# Patient Record
Sex: Female | Born: 1990 | Race: Black or African American | Hispanic: No | Marital: Married | Smoking: Never smoker
Health system: Southern US, Community
[De-identification: ages and names within clinical notes are randomized; demographics above are authoritative.]

## PROBLEM LIST (undated history)

## (undated) DIAGNOSIS — D649 Anemia, unspecified: Secondary | ICD-10-CM

## (undated) DIAGNOSIS — D259 Leiomyoma of uterus, unspecified: Secondary | ICD-10-CM

## (undated) DIAGNOSIS — Z8679 Personal history of other diseases of the circulatory system: Secondary | ICD-10-CM

## (undated) DIAGNOSIS — T8859XA Other complications of anesthesia, initial encounter: Secondary | ICD-10-CM

## (undated) HISTORY — DX: Leiomyoma of uterus, unspecified: D25.9

## (undated) HISTORY — DX: Anemia, unspecified: D64.9

## (undated) HISTORY — PX: UTERINE FIBROID SURGERY: SHX826

---

## 2009-09-13 ENCOUNTER — Encounter: Admission: RE | Admit: 2009-09-13 | Discharge: 2009-09-13 | Payer: Self-pay | Admitting: Infectious Diseases

## 2011-06-21 HISTORY — PX: ROBOT ASSISTED MYOMECTOMY: SHX5142

## 2014-04-22 ENCOUNTER — Ambulatory Visit (INDEPENDENT_AMBULATORY_CARE_PROVIDER_SITE_OTHER): Payer: 59 | Admitting: Family Medicine

## 2014-04-22 VITALS — BP 122/78 | HR 78 | Temp 98.1°F | Resp 16 | Ht 63.0 in | Wt 153.0 lb

## 2014-04-22 DIAGNOSIS — Z8742 Personal history of other diseases of the female genital tract: Secondary | ICD-10-CM

## 2014-04-22 DIAGNOSIS — N912 Amenorrhea, unspecified: Secondary | ICD-10-CM | POA: Diagnosis not present

## 2014-04-22 DIAGNOSIS — N939 Abnormal uterine and vaginal bleeding, unspecified: Secondary | ICD-10-CM | POA: Diagnosis not present

## 2014-04-22 DIAGNOSIS — Z86018 Personal history of other benign neoplasm: Secondary | ICD-10-CM

## 2014-04-22 LAB — POCT CBC
GRANULOCYTE PERCENT: 42.5 % (ref 37–80)
HCT, POC: 37.8 % (ref 37.7–47.9)
Hemoglobin: 12 g/dL — AB (ref 12.2–16.2)
Lymph, poc: 2.2 (ref 0.6–3.4)
MCH, POC: 22.5 pg — AB (ref 27–31.2)
MCHC: 31.8 g/dL (ref 31.8–35.4)
MCV: 70.7 fL — AB (ref 80–97)
MID (CBC): 0.4 (ref 0–0.9)
MPV: 7.1 fL (ref 0–99.8)
PLATELET COUNT, POC: 360 10*3/uL (ref 142–424)
POC GRANULOCYTE: 2 (ref 2–6.9)
POC LYMPH PERCENT: 48.1 %L (ref 10–50)
POC MID %: 9.4 % (ref 0–12)
RBC: 5.35 M/uL (ref 4.04–5.48)
RDW, POC: 14.8 %
WBC: 4.6 10*3/uL (ref 4.6–10.2)

## 2014-04-22 LAB — POCT URINE PREGNANCY: Preg Test, Ur: NEGATIVE

## 2014-04-22 LAB — TSH: TSH: 1.045 u[IU]/mL (ref 0.350–4.500)

## 2014-04-22 NOTE — Progress Notes (Addendum)
Subjective:    Patient ID: Jill Herrera, female    DOB: September 11, 1990, 24 y.o.   MRN: [id number] This chart was scribed for Jill Ray, MD by Marti Sleigh, Medical Scribe. This patient was seen in Room 3 and the patient's care was started a 3:19 PM.  Chief Complaint  Patient presents with  . Referral    OBGYN    HPI HPI Comments: Jill Herrera is a 24 y.o. female with a family hx of uterine fibroids who presents to [facility] to obtain a referral for an OBGYN. Pt states she had a thyroid surgically removed three years ago. Pt states her LNMP was in December, and since that time she has had intermittent spotting. Pt states she completely stopped taking her oral birth control in May, 2015. Pt states her oral birth control was supposed to make her have a period once every three months, but she had spotting throughout the three month period between when she was supposed to have her periods. Pt states she has noticed her stomach has been more swollen recently, similar to when she had the fibroid in the past.  Pt also states that she had a large clot of blood come out of her vagina one week ago at 4am, otherwise has just had spotting every few days.  Pt states she has had a pinched nerve in the past, but no other chronic medical problems.   There are no active problems to display for this patient.  Past Medical History  Diagnosis Date  . Anemia   . Fibroid of cervix    Past Surgical History  Procedure Laterality Date  . Uterine fibroid surgery     No Known Allergies Prior to Admission medications   Not on File   History   Social History  . Marital Status: Single    Spouse Name: N/A  . Number of Children: N/A  . Years of Education: N/A   Occupational History  . Not on file.   Social History Main Topics  . Smoking status: Never Smoker   . Smokeless tobacco: Not on file  . Alcohol Use: Not on file  . Drug Use: Not on file  . Sexual Activity: Not on file   Other Topics  Concern  . Not on file   Social History Narrative  . No narrative on file     Review of Systems  Constitutional:       No hx of STI.  Gastrointestinal: Negative for abdominal pain.       Abdominal fullness.  Genitourinary: Positive for vaginal bleeding. Negative for vaginal discharge.       Objective:   Physical Exam  Constitutional: She is oriented to person, place, and time. She appears well-developed and well-nourished. No distress.  HENT:  Head: Normocephalic and atraumatic.  Eyes: Pupils are equal, round, and reactive to light.  Neck: Neck supple.  Cardiovascular: Normal rate.   Pulmonary/Chest: Effort normal. No respiratory distress.  Musculoskeletal: Normal range of motion.  Neurological: She is alert and oriented to person, place, and time. Coordination normal.  Skin: Skin is warm and dry. She is not diaphoretic.  Psychiatric: She has a normal mood and affect. Her behavior is normal.  Nursing note and vitals reviewed.   Filed Vitals:   04/22/14 1453  BP: 122/78  Pulse: 78  Temp: 98.1 F (36.7 C)  TempSrc: Oral  Resp: 16  Height: 5\' 3"  (1.6 m)  Weight: 153 lb (69.4 kg)  SpO2: 100%   Results  for orders placed or performed in visit on 04/22/14  POCT CBC  Result Value Ref Range   WBC 4.6 4.6 - 10.2 K/uL   Lymph, poc 2.2 0.6 - 3.4   POC LYMPH PERCENT 48.1 10 - 50 %L   MID (cbc) 0.4 0 - 0.9   POC MID % 9.4 0 - 12 %M   POC Granulocyte 2.0 2 - 6.9   Granulocyte percent 42.5 37 - 80 %G   RBC 5.35 4.04 - 5.48 M/uL   Hemoglobin 12.0 (A) 12.2 - 16.2 g/dL   HCT, POC 37.8 37.7 - 47.9 %   MCV 70.7 (A) 80 - 97 fL   MCH, POC 22.5 (A) 27 - 31.2 pg   MCHC 31.8 31.8 - 35.4 g/dL   RDW, POC 14.8 %   Platelet Count, POC 360 142 - 424 K/uL   MPV 7.1 0 - 99.8 fL  POCT urine pregnancy  Result Value Ref Range   Preg Test, Ur Negative        Assessment & Plan:  Terris Bodin is a 24 y.o. female Amenorrhea - Plan: POCT CBC, POCT urine pregnancy, TSH, Prolactin,  Ambulatory referral to Gynecology  Abnormal uterine bleeding - Plan: POCT CBC, POCT urine pregnancy, TSH, Prolactin, Ambulatory referral to Gynecology  History of uterine fibroid - Plan: Ambulatory referral to Gynecology  AUB, now with amenorrhea. Prior history of fibroids - structural cause possible, but breakthrough bleeding prior may be due to type of OCP's. hcg negative, tsh and prolactin pending. CBC overall ok. Will refer to obgyn for further eval. Can follow up with me at her convenience for physical as established care with me now.   No orders of the defined types were placed in this encounter.   Patient Instructions  You should receive a call or letter about your lab results within the next week to 10 days. Follow up for physical in next 6 months or so.  Return to the clinic or go to the nearest emergency room if any of your symptoms worsen or new symptoms occur.       I personally performed the services described in this documentation, which was scribed in my presence. The recorded information has been reviewed and considered, and addended by me as needed.

## 2014-04-22 NOTE — Patient Instructions (Signed)
You should receive a call or letter about your lab results within the next week to 10 days. Follow up for physical in next 6 months or so.  Return to the clinic or go to the nearest emergency room if any of your symptoms worsen or new symptoms occur.

## 2014-04-23 LAB — PROLACTIN: Prolactin: 14 ng/mL

## 2014-04-24 ENCOUNTER — Encounter: Payer: Self-pay | Admitting: Radiology

## 2014-04-27 LAB — HM PAP SMEAR: HM PAP: NEGATIVE

## 2014-07-01 ENCOUNTER — Ambulatory Visit (INDEPENDENT_AMBULATORY_CARE_PROVIDER_SITE_OTHER): Payer: 59 | Admitting: Physician Assistant

## 2014-07-01 VITALS — BP 110/70 | HR 73 | Temp 98.4°F | Resp 16 | Ht 63.0 in | Wt 151.4 lb

## 2014-07-01 DIAGNOSIS — Z862 Personal history of diseases of the blood and blood-forming organs and certain disorders involving the immune mechanism: Secondary | ICD-10-CM | POA: Diagnosis not present

## 2014-07-01 DIAGNOSIS — K219 Gastro-esophageal reflux disease without esophagitis: Secondary | ICD-10-CM | POA: Diagnosis not present

## 2014-07-01 LAB — POCT CBC
GRANULOCYTE PERCENT: 42.1 % (ref 37–80)
HCT, POC: 35.8 % — AB (ref 37.7–47.9)
HEMOGLOBIN: 11.3 g/dL — AB (ref 12.2–16.2)
Lymph, poc: 2.6 (ref 0.6–3.4)
MCH: 21.9 pg — AB (ref 27–31.2)
MCHC: 31.4 g/dL — AB (ref 31.8–35.4)
MCV: 69.8 fL — AB (ref 80–97)
MID (cbc): 0.5 (ref 0–0.9)
MPV: 7.4 fL (ref 0–99.8)
POC Granulocyte: 2.3 (ref 2–6.9)
POC LYMPH PERCENT: 48.4 %L (ref 10–50)
POC MID %: 9.5 %M (ref 0–12)
Platelet Count, POC: 363 10*3/uL (ref 142–424)
RBC: 5.13 M/uL (ref 4.04–5.48)
RDW, POC: 14.6 %
WBC: 5.4 10*3/uL (ref 4.6–10.2)

## 2014-07-01 MED ORDER — OMEPRAZOLE 40 MG PO CPDR: DELAYED_RELEASE_CAPSULE | ORAL | Status: DC

## 2014-07-01 NOTE — Patient Instructions (Signed)
If you do not feel better in 48 hours, or feel worse at anytime please call [phone] and leave me a message with the staff.

## 2014-07-01 NOTE — Progress Notes (Signed)
07/01/2014 at 5:56 PM  Jill Herrera / DOB: May 27, 1990 / MRN: [id number]  The patient  does not have a problem list on file.  SUBJECTIVE  Chief complaint: Abdominal Pain and Heartburn   Jill Herrera is a pleasant 24 yo female here today for complaints of mild left upper quadrant abdominal pain and acid reflux over the past 7 days.  She took one Aleve last week for menstrual cramping and reports the pain and reflux started roughly 8 hours later.  She took the medication with food.  She has sickle cell trait. She denies hematemesis and melena and hematochezia. She denies constipation at this time. She denies dysuria, urgency and frequency.  She denies cough, chest pain and palpitations.  She denies SOB with the pain.  She has tried to stop eating spicy foods and this has not helped her symptoms.  She has tried ranitidine with good relief, however it is transient. She has a history of sickle cell trait. This is a new problem for her.     She  has a past medical history of Anemia and Fibroid of cervix.    Medications reviewed and updated by myself where necessary, and exist elsewhere in the encounter.   [name] has No Known Allergies. She  reports that she has never smoked. She does not have any smokeless tobacco history on file. She  has no sexual activity history on file. The patient  has past surgical history that includes Uterine fibroid surgery.  Her family history is not on file.  Review of Systems  Constitutional: Negative for fever.  Respiratory: Negative for cough.   Cardiovascular: Negative for chest pain and palpitations.  Gastrointestinal: Positive for abdominal pain. Negative for heartburn, nausea, vomiting, diarrhea, constipation, blood in stool and melena.  Genitourinary: Negative for dysuria, urgency and frequency.  Musculoskeletal: Negative for myalgias.  Skin: Negative for itching and rash.  Neurological: Negative for dizziness and headaches.    Endo/Heme/Allergies: Does not bruise/bleed easily.     OBJECTIVE  Her  height is 5\' 3"  (1.6 m) and weight is 151 lb 6.4 oz (68.675 kg). Her oral temperature is 98.4 F (36.9 C). Her blood pressure is 110/70 and her pulse is 73. Her respiration is 16 and oxygen saturation is 98%.  The patient's body mass index is 26.83 kg/(m^2).  Physical Exam  Constitutional: She is oriented to person, place, and time. She appears well-developed and well-nourished. No distress.  Neck: Normal range of motion.  Cardiovascular: Normal rate and regular rhythm.   Respiratory: Effort normal and breath sounds normal.  GI: Soft. Bowel sounds are normal. There is no splenomegaly or hepatomegaly. There is tenderness in the right upper quadrant. There is no rigidity, no guarding, no CVA tenderness, no tenderness at McBurney's point and negative Murphy's sign.    Neurological: She is alert and oriented to person, place, and time.  Skin: Skin is warm and dry. She is not diaphoretic. No pallor.  Psychiatric: She has a normal mood and affect. Her speech is normal and behavior is normal. Judgment and thought content normal. Cognition and memory are normal.    Results for orders placed or performed in visit on 07/01/14 (from the past 24 hour(s))  POCT CBC     Status: Abnormal   Collection Time: 07/01/14  5:20 PM  Result Value Ref Range   WBC 5.4 4.6 - 10.2 K/uL   Lymph, poc 2.6 0.6 - 3.4   POC LYMPH PERCENT 48.4 10 - 50 %L  MID (cbc) 0.5 0 - 0.9   POC MID % 9.5 0 - 12 %M   POC Granulocyte 2.3 2 - 6.9   Granulocyte percent 42.1 37 - 80 %G   RBC 5.13 4.04 - 5.48 M/uL   Hemoglobin 11.3 (A) 12.2 - 16.2 g/dL   HCT, POC 35.8 (A) 37.7 - 47.9 %   MCV 69.8 (A) 80 - 97 fL   MCH, POC 21.9 (A) 27 - 31.2 pg   MCHC 31.4 (A) 31.8 - 35.4 g/dL   RDW, POC 14.6 %   Platelet Count, POC 363 142 - 424 K/uL   MPV 7.4 0 - 99.8 fL    [name] was seen today for abdominal pain and heartburn.  Diagnoses and  all orders for this visit:  Gastroesophageal reflux disease, esophagitis presence not specified: CBC stable, exam reassuring, and patient does not appear to be in any distress. Advised that we could evaluate for melena with a hemosure and patient declined in favor of medication. Will treat empirically with PPI for two weeks.  Patient to follow up after this trial. She is to call, RTC, or go to the ED if her symptoms worsen or she becomes distressed.   Orders: -     POCT CBC -     omeprazole (PRILOSEC) 40 MG capsule; Take your first dose now.  Tomorrow take your medication 30 minutes before breakfast.  You should be pain free in 48 hours.    The patient was advised to call or come back to clinic if she does not see an improvement in symptoms, or worsens with the above plan.   Philis Fendt, MHS, PA-C [facility] 07/01/2014 5:56 PM

## 2014-07-02 ENCOUNTER — Encounter: Payer: Self-pay | Admitting: Physician Assistant

## 2015-02-05 ENCOUNTER — Ambulatory Visit (INDEPENDENT_AMBULATORY_CARE_PROVIDER_SITE_OTHER): Payer: 59 | Admitting: Family Medicine

## 2015-02-05 VITALS — BP 126/80 | HR 70 | Temp 99.4°F | Resp 16 | Ht 62.0 in | Wt 149.0 lb

## 2015-02-05 DIAGNOSIS — E282 Polycystic ovarian syndrome: Secondary | ICD-10-CM

## 2015-02-05 DIAGNOSIS — N946 Dysmenorrhea, unspecified: Secondary | ICD-10-CM | POA: Diagnosis not present

## 2015-02-05 DIAGNOSIS — R1030 Lower abdominal pain, unspecified: Secondary | ICD-10-CM

## 2015-02-05 DIAGNOSIS — Z86018 Personal history of other benign neoplasm: Secondary | ICD-10-CM

## 2015-02-05 DIAGNOSIS — Z8742 Personal history of other diseases of the female genital tract: Secondary | ICD-10-CM | POA: Diagnosis not present

## 2015-02-05 LAB — POCT URINALYSIS DIP (MANUAL ENTRY)
Bilirubin, UA: NEGATIVE
Glucose, UA: NEGATIVE
Ketones, POC UA: NEGATIVE
Leukocytes, UA: NEGATIVE
Nitrite, UA: NEGATIVE
Protein Ur, POC: NEGATIVE
Spec Grav, UA: 1.015
Urobilinogen, UA: 0.2
pH, UA: 6.5

## 2015-02-05 LAB — POC MICROSCOPIC URINALYSIS (UMFC): Mucus: ABSENT

## 2015-02-05 LAB — POCT URINE PREGNANCY: Preg Test, Ur: NEGATIVE

## 2015-02-05 MED ORDER — NAPROXEN SODIUM 550 MG PO TABS: 550.0000 mg | ORAL_TABLET | Freq: Two times a day (BID) | ORAL | Status: DC | PRN

## 2015-02-05 MED ORDER — CYCLOBENZAPRINE HCL 10 MG PO TABS: 10.0000 mg | ORAL_TABLET | Freq: Every evening | ORAL | Status: DC | PRN

## 2015-02-05 NOTE — Patient Instructions (Signed)

## 2015-02-05 NOTE — Progress Notes (Signed)
Subjective:    Patient ID: Jill Herrera, female    DOB: 1990/10/24, 24 y.o.   MRN: [id number]  HPI This is a pleasant 24 yo patient who presents today with worsening menstrual cramps. She has had painful periods for several years and takes OCPs. She has not missed any pills in her current pack. She took her last pill on Sunday and had some light bleeding and cramping. She is currently having normal menstrual flow but worsening cramp. She has a history of fibroids with fibroid removal about 3 years ago. She had an ultrasound this spring which showed PCOS. She is planning follow up with ob-gyn. She is currently on OCPs which helps some with cramping, but not completely. She takes tylenol without much relief. She had a left over pain pill which she took with some relief. Is not looking for narcotic pain meds, because she doesn't like the way they make her feel. Has had difficulty sleeping this week due to pain and missed work. 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   Past Medical History  Diagnosis Date  . Anemia   . Fibroid of cervix    Past Surgical History  Procedure Laterality Date  . Uterine fibroid surgery     No family history on file. Social History  Substance Use Topics  . Smoking status: Never Smoker   . Smokeless tobacco: None  . Alcohol Use: None    Review of Systems  Constitutional: Negative for fever and chills.  Gastrointestinal: Positive for abdominal pain (lower, bilateral).  Genitourinary: Positive for vaginal bleeding (menstrual bleeding). Negative for dysuria, hematuria, flank pain, vaginal discharge, vaginal pain and dyspareunia.       Objective:   Physical Exam  Constitutional: She is oriented to person, place, and time. She appears well-developed and well-nourished. No distress.  HENT:  Head: Normocephalic and  atraumatic.  Eyes: Conjunctivae are normal.  Neck: Normal range of motion. Neck supple.  Cardiovascular: Normal rate, regular rhythm and normal heart sounds.   Pulmonary/Chest: Effort normal and breath sounds normal.  Abdominal: Soft. Bowel sounds are normal. She exhibits no distension and no mass. There is no tenderness. There is no rebound and no guarding.  Musculoskeletal: Normal range of motion.  Neurological: She is alert and oriented to person, place, and time.  Skin: Skin is warm and dry. She is not diaphoretic.  Psychiatric: She has a normal mood and affect. Her behavior is normal. Judgment and thought content normal.  Vitals reviewed. BP 126/80 mmHg  Pulse 70  Temp(Src) 99.4 F (37.4 C)  Resp 16  Ht 5\' 2"  (1.575 m)  Wt 149 lb (67.586 kg)  BMI 27.25 kg/m2  SpO2 99%  LMP 02/03/2015 (Exact Date) Results for orders placed or performed in visit on 02/05/15  POCT urine pregnancy  Result Value Ref Range   Preg Test, Ur Negative Negative  POCT urinalysis dipstick  Result Value Ref Range   Color, UA yellow yellow   Clarity, UA hazy (A) clear   Glucose, UA negative negative   Bilirubin, UA negative negative   Ketones, POC UA negative negative   Spec Grav, UA 1.015    Blood, UA large (A) negative   pH, UA 6.5    Protein Ur, POC negative negative   Urobilinogen, UA 0.2    Nitrite, UA Negative Negative   Leukocytes, UA Negative  Negative  POCT Microscopic Urinalysis ([facility])  Result Value Ref Range   WBC,UR,HPF,POC None None WBC/hpf   RBC,UR,HPF,POC Too numerous to count  (A) None RBC/hpf   Bacteria None None, Too numerous to count   Mucus Absent Absent   Epithelial Cells, UR Per Microscopy Few (A) None, Too numerous to count cells/hpf       Assessment & Plan:  1. Lower abdominal pain - POCT urine pregnancy - POCT urinalysis dipstick - POCT Microscopic Urinalysis ([facility])  2. Severe menstrual cramps - naproxen sodium (ANAPROX DS) 550 MG tablet; Take 1 tablet (550 mg  total) by mouth 2 (two) times daily as needed. Take with food  Dispense: 20 tablet; Refill: 1 - cyclobenzaprine (FLEXERIL) 10 MG tablet; Take 1 tablet (10 mg total) by mouth at bedtime as needed for muscle spasms.  Dispense: 20 tablet; Refill: 1  3. History of uterine fibroid - encouraged continued follow up with gyn  4. PCOS (polycystic ovarian syndrome) - discussed pain that could be related to ovarian cysts. Follow up precautions given- worsening of pain, excessive bleeding, fever.   Clarene Reamer, FNP-BC  [facility]  02/05/2015 4:08 PM

## 2015-05-04 ENCOUNTER — Telehealth: Payer: Self-pay | Admitting: Family Medicine

## 2015-05-04 NOTE — Telephone Encounter (Signed)
Spoke with patient about the flu shot.  She is going to decline at this time.  She had a reaction to it last time.  Her arm swelled up and got really itchy around the sight and she felt sick after the shot.

## 2015-06-04 ENCOUNTER — Other Ambulatory Visit: Payer: Self-pay | Admitting: Family Medicine

## 2015-06-04 ENCOUNTER — Ambulatory Visit (INDEPENDENT_AMBULATORY_CARE_PROVIDER_SITE_OTHER): Payer: BLUE CROSS/BLUE SHIELD | Admitting: Family Medicine

## 2015-06-04 ENCOUNTER — Encounter: Payer: Self-pay | Admitting: Family Medicine

## 2015-06-04 VITALS — BP 118/70 | HR 100 | Temp 98.3°F | Resp 17 | Ht 62.5 in | Wt 149.0 lb

## 2015-06-04 DIAGNOSIS — D649 Anemia, unspecified: Secondary | ICD-10-CM | POA: Diagnosis not present

## 2015-06-04 DIAGNOSIS — R55 Syncope and collapse: Secondary | ICD-10-CM | POA: Diagnosis not present

## 2015-06-04 DIAGNOSIS — R42 Dizziness and giddiness: Secondary | ICD-10-CM | POA: Diagnosis not present

## 2015-06-04 DIAGNOSIS — N926 Irregular menstruation, unspecified: Secondary | ICD-10-CM | POA: Diagnosis not present

## 2015-06-04 DIAGNOSIS — E282 Polycystic ovarian syndrome: Secondary | ICD-10-CM

## 2015-06-04 DIAGNOSIS — R21 Rash and other nonspecific skin eruption: Secondary | ICD-10-CM

## 2015-06-04 LAB — POCT URINALYSIS DIP (MANUAL ENTRY)
Bilirubin, UA: NEGATIVE
GLUCOSE UA: NEGATIVE
Ketones, POC UA: NEGATIVE
LEUKOCYTES UA: NEGATIVE
Nitrite, UA: NEGATIVE
PROTEIN UA: NEGATIVE
Spec Grav, UA: 1.02
UROBILINOGEN UA: 0.2
pH, UA: 6.5

## 2015-06-04 LAB — POCT CBC
Granulocyte percent: 42.8 %G (ref 37–80)
HEMATOCRIT: 35.2 % — AB (ref 37.7–47.9)
Hemoglobin: 11.7 g/dL — AB (ref 12.2–16.2)
LYMPH, POC: 2.4 (ref 0.6–3.4)
MCH, POC: 23.3 pg — AB (ref 27–31.2)
MCHC: 33.2 g/dL (ref 31.8–35.4)
MCV: 70.2 fL — AB (ref 80–97)
MID (CBC): 0.2 (ref 0–0.9)
MPV: 7.9 fL (ref 0–99.8)
PLATELET COUNT, POC: 413 10*3/uL (ref 142–424)
POC GRANULOCYTE: 2 (ref 2–6.9)
POC LYMPH %: 52.3 % — AB (ref 10–50)
POC MID %: 4.9 %M (ref 0–12)
RBC: 5.02 M/uL (ref 4.04–5.48)
RDW, POC: 14.5 %
WBC: 4.6 10*3/uL (ref 4.6–10.2)

## 2015-06-04 LAB — POCT URINE PREGNANCY: PREG TEST UR: NEGATIVE

## 2015-06-04 LAB — GLUCOSE, POCT (MANUAL RESULT ENTRY): POC GLUCOSE: 98 mg/dL (ref 70–99)

## 2015-06-04 NOTE — Patient Instructions (Addendum)
IF you received an x-ray today, you will receive an invoice from [facility]. Please contact [facility] at [phone] with questions or concerns regarding your invoice.   IF you received labwork today, you will receive an invoice from [employer]. Please contact [facility] at [phone] with questions or concerns regarding your invoice.   Our billing staff will not be able to assist you with questions regarding bills from these companies.  You will be contacted with the lab results as soon as they are available. The fastest way to get your results is to activate your My Chart account. Instructions are located on the last page of this paperwork. If you have not heard from [place] regarding the results in 2 weeks, please contact this office.     Your anemia is borderline today, can take iron supplement over-the-counter once per day, but this is unlikely the cause of your dizziness. Your EKG, and other tests here today were also reassuring, but I will also send off some kidney, electrolyte tests.  Increase fluid and calorie intake this weekend, but if symptoms are not improving into Monday or Tuesday, I did order a referral for cardiology evaluation. If you are dizzy or lightheaded, recommended against driving, and out of work until your symptoms have improved.  Your rash may be a contact dermatitis, or hives. See information on this below, but you can use cortisone cream over-the-counter to the itching areas, and Benadryl up to every 4-6 hours, but be careful with Benadryl as it can also cause sedation and dizziness. If continues to spread or worsening symptoms, return for recheck.    Return to the clinic or go to the nearest emergency room if any of your symptoms worsen or new symptoms occur.  Syncope Syncope is a medical term for fainting or passing out. This means you lose consciousness and drop to the ground. People are generally unconscious for  less than 5 minutes. You may have some muscle twitches for up to 15 seconds before waking up and returning to normal. Syncope occurs more often in older adults, but it can happen to anyone. While most causes of syncope are not dangerous, syncope can be a sign of a serious medical problem. It is important to seek medical care.  CAUSES  Syncope is caused by a sudden drop in blood flow to the brain. The specific cause is often not determined. Factors that can bring on syncope include:  Taking medicines that lower blood pressure.  Sudden changes in posture, such as standing up quickly.  Taking more medicine than prescribed.  Standing in one place for too long.  Seizure disorders.  Dehydration and excessive exposure to heat.  Low blood sugar (hypoglycemia).  Straining to have a bowel movement.  Heart disease, irregular heartbeat, or other circulatory problems.  Fear, emotional distress, seeing blood, or severe pain. SYMPTOMS  Right before fainting, you may:  Feel dizzy or light-headed.  Feel nauseous.  See all white or all black in your field of vision.  Have cold, clammy skin. DIAGNOSIS  Your health care provider will ask about your symptoms, perform a physical exam, and perform an electrocardiogram (ECG) to record the electrical activity of your heart. Your health care provider may also perform other heart or blood tests to determine the cause of your syncope which may include:  Transthoracic echocardiogram (TTE). During echocardiography, sound waves are used to evaluate how blood flows through your heart.  Transesophageal echocardiogram (TEE).  Cardiac monitoring.  This allows your health care provider to monitor your heart rate and rhythm in real time.  Holter monitor. This is a portable device that records your heartbeat and can help diagnose heart arrhythmias. It allows your health care provider to track your heart activity for several days, if needed.  Stress tests by  exercise or by giving medicine that makes the heart beat faster. TREATMENT  In most cases, no treatment is needed. Depending on the cause of your syncope, your health care provider may recommend changing or stopping some of your medicines. HOME CARE INSTRUCTIONS  Have someone stay with you until you feel stable.  Do not drive, use machinery, or play sports until your health care provider says it is okay.  Keep all follow-up appointments as directed by your health care provider.  Lie down right away if you start feeling like you might faint. Breathe deeply and steadily. Wait until all the symptoms have passed.  Drink enough fluids to keep your urine clear or pale yellow.  If you are taking blood pressure or heart medicine, get up slowly and take several minutes to sit and then stand. This can reduce dizziness. SEEK IMMEDIATE MEDICAL CARE IF:   You have a severe headache.  You have unusual pain in the chest, abdomen, or back.  You are bleeding from your mouth or rectum, or you have black or tarry stool.  You have an irregular or very fast heartbeat.  You have pain with breathing.  You have repeated fainting or seizure-like jerking during an episode.  You faint when sitting or lying down.  You have confusion.  You have trouble walking.  You have severe weakness.  You have vision problems. If you fainted, call your local emergency services (911 in [place] Do not drive yourself to the hospital.    This information is not intended to replace advice given to you by your health care provider. Make sure you discuss any questions you have with your health care provider.   Document Released: 02/06/2005 Document Revised: 06/23/2014 Document Reviewed: 04/07/2011 Elsevier Interactive Patient Education 2016 [employer]  Dizziness Dizziness is a common problem. It is a feeling of unsteadiness or light-headedness. You may feel like you are about to faint. Dizziness can lead to injury  if you stumble or fall. Anyone can become dizzy, but dizziness is more common in older adults. This condition can be caused by a number of things, including medicines, dehydration, or illness. HOME CARE INSTRUCTIONS Taking these steps may help with your condition: Eating and Drinking  Drink enough fluid to keep your urine clear or pale yellow. This helps to keep you from becoming dehydrated. Try to drink more clear fluids, such as water.  Do not drink alcohol.  Limit your caffeine intake if directed by your health care provider.  Limit your salt intake if directed by your health care provider. Activity  Avoid making quick movements.  Rise slowly from chairs and steady yourself until you feel okay.  In the morning, first sit up on the side of the bed. When you feel okay, stand slowly while you hold onto something until you know that your balance is fine.  Move your legs often if you need to stand in one place for a long time. Tighten and relax your muscles in your legs while you are standing.  Do not drive or operate heavy machinery if you feel dizzy.  Avoid bending down if you feel dizzy. Place items in your home so that  they are easy for you to reach without leaning over. Lifestyle  Do not use any tobacco products, including cigarettes, chewing tobacco, or electronic cigarettes. If you need help quitting, ask your health care provider.  Try to reduce your stress level, such as with yoga or meditation. Talk with your health care provider if you need help. General Instructions  Watch your dizziness for any changes.  Take medicines only as directed by your health care provider. Talk with your health care provider if you think that your dizziness is caused by a medicine that you are taking.  Tell a friend or a family member that you are feeling dizzy. If he or she notices any changes in your behavior, have this person call your health care provider.  Keep all follow-up visits as  directed by your health care provider. This is important. SEEK MEDICAL CARE IF:  Your dizziness does not go away.  Your dizziness or light-headedness gets worse.  You feel nauseous.  You have reduced hearing.  You have new symptoms.  You are unsteady on your feet or you feel like the room is spinning. SEEK IMMEDIATE MEDICAL CARE IF:  You vomit or have diarrhea and are unable to eat or drink anything.  You have problems talking, walking, swallowing, or using your arms, hands, or legs.  You feel generally weak.  You are not thinking clearly or you have trouble forming sentences. It may take a friend or family member to notice this.  You have chest pain, abdominal pain, shortness of breath, or sweating.  Your vision changes.  You notice any bleeding.  You have a headache.  You have neck pain or a stiff neck.  You have a fever.   This information is not intended to replace advice given to you by your health care provider. Make sure you discuss any questions you have with your health care provider.  Hives Hives are itchy, red, swollen areas of the skin. They can vary in size and location on your body. Hives can come and go for hours or several days (acute hives) or for several weeks (chronic hives). Hives do not spread from person to person (noncontagious). They may get worse with scratching, exercise, and emotional stress. CAUSES   Allergic reaction to food, additives, or drugs.  Infections, including the common cold.  Illness, such as vasculitis, lupus, or thyroid disease.  Exposure to sunlight, heat, or cold.  Exercise.  Stress.  Contact with chemicals. SYMPTOMS   Red or white swollen patches on the skin. The patches may change size, shape, and location quickly and repeatedly.  Itching.  Swelling of the hands, feet, and face. This may occur if hives develop deeper in the skin. DIAGNOSIS  Your caregiver can usually tell what is wrong by performing a  physical exam. Skin or blood tests may also be done to determine the cause of your hives. In some cases, the cause cannot be determined. TREATMENT  Mild cases usually get better with medicines such as antihistamines. Severe cases may require an emergency epinephrine injection. If the cause of your hives is known, treatment includes avoiding that trigger.  HOME CARE INSTRUCTIONS   Avoid causes that trigger your hives.  Take antihistamines as directed by your caregiver to reduce the severity of your hives. Non-sedating or low-sedating antihistamines are usually recommended. Do not drive while taking an antihistamine.  Take any other medicines prescribed for itching as directed by your caregiver.  Wear loose-fitting clothing.  Keep all follow-up appointments  as directed by your caregiver. SEEK MEDICAL CARE IF:   You have persistent or severe itching that is not relieved with medicine.  You have painful or swollen joints. SEEK IMMEDIATE MEDICAL CARE IF:   You have a fever.  Your tongue or lips are swollen.  You have trouble breathing or swallowing.  You feel tightness in the throat or chest.  You have abdominal pain. These problems may be the first sign of a life-threatening allergic reaction. Call your local emergency services (911 in [place] MAKE SURE YOU:   Understand these instructions.  Will watch your condition.  Will get help right away if you are not doing well or get worse.   This information is not intended to replace advice given to you by your health care provider. Make sure you discuss any questions you have with your health care provider.   Document Released: 02/06/2005 Document Revised: 02/11/2013 Document Reviewed: 05/02/2011 Elsevier Interactive Patient Education 2016 [employer] Dermatitis Dermatitis is redness, soreness, and swelling (inflammation) of the skin. Contact dermatitis is a reaction to certain substances that touch the skin. There are  two types of contact dermatitis:   Irritant contact dermatitis. This type is caused by something that irritates your skin, such as dry hands from washing them too much. This type does not require previous exposure to the substance for a reaction to occur. This type is more common.  Allergic contact dermatitis. This type is caused by a substance that you are allergic to, such as a nickel allergy or poison ivy. This type only occurs if you have been exposed to the substance (allergen) before. Upon a repeat exposure, your body reacts to the substance. This type is less common. CAUSES  Many different substances can cause contact dermatitis. Irritant contact dermatitis is most commonly caused by exposure to:   Makeup.   Soaps.   Detergents.   Bleaches.   Acids.   Metal salts, such as nickel.  Allergic contact dermatitis is most commonly caused by exposure to:   Poisonous plants.   Chemicals.   Jewelry.   Latex.   Medicines.   Preservatives in products, such as clothing.  RISK FACTORS This condition is more likely to develop in:   People who have jobs that expose them to irritants or allergens.  People who have certain medical conditions, such as asthma or eczema.  SYMPTOMS  Symptoms of this condition may occur anywhere on your body where the irritant has touched you or is touched by you. Symptoms include:  Dryness or flaking.   Redness.   Cracks.   Itching.   Pain or a burning feeling.   Blisters.  Drainage of small amounts of blood or clear fluid from skin cracks. With allergic contact dermatitis, there may also be swelling in areas such as the eyelids, mouth, or genitals.  DIAGNOSIS  This condition is diagnosed with a medical history and physical exam. A patch skin test may be performed to help determine the cause. If the condition is related to your job, you may need to see an occupational medicine specialist. TREATMENT Treatment for this  condition includes figuring out what caused the reaction and protecting your skin from further contact. Treatment may also include:   Steroid creams or ointments. Oral steroid medicines may be needed in more severe cases.  Antibiotics or antibacterial ointments, if a skin infection is present.  Antihistamine lotion or an antihistamine taken by mouth to ease itching.  A bandage (dressing). HOME CARE INSTRUCTIONS  Skin Care  Moisturize your skin as needed.   Apply cool compresses to the affected areas.  Try taking a bath with:  Epsom salts. Follow the instructions on the packaging. You can get these at your local pharmacy or grocery store.  Baking soda. Pour a small amount into the bath as directed by your health care provider.  Colloidal oatmeal. Follow the instructions on the packaging. You can get this at your local pharmacy or grocery store.  Try applying baking soda paste to your skin. Stir water into baking soda until it reaches a paste-like consistency.  Do not scratch your skin.  Bathe less frequently, such as every other day.  Bathe in lukewarm water. Avoid using hot water. Medicines  Take or apply over-the-counter and prescription medicines only as told by your health care provider.   If you were prescribed an antibiotic medicine, take or apply your antibiotic as told by your health care provider. Do not stop using the antibiotic even if your condition starts to improve. General Instructions  Keep all follow-up visits as told by your health care provider. This is important.  Avoid the substance that caused your reaction. If you do not know what caused it, keep a journal to try to track what caused it. Write down:  What you eat.  What cosmetic products you use.  What you drink.  What you wear in the affected area. This includes jewelry.  If you were given a dressing, take care of it as told by your health care provider. This includes when to change and  remove it. SEEK MEDICAL CARE IF:   Your condition does not improve with treatment.  Your condition gets worse.  You have signs of infection such as swelling, tenderness, redness, soreness, or warmth in the affected area.  You have a fever.  You have new symptoms. SEEK IMMEDIATE MEDICAL CARE IF:   You have a severe headache, neck pain, or neck stiffness.  You vomit.  You feel very sleepy.  You notice red streaks coming from the affected area.  Your bone or joint underneath the affected area becomes painful after the skin has healed.  The affected area turns darker.  You have difficulty breathing.   This information is not intended to replace advice given to you by your health care provider. Make sure you discuss any questions you have with your health care provider.   Document Released: 02/04/2000 Document Revised: 10/28/2014 Document Reviewed: 06/24/2014 Elsevier Interactive Patient Education 2016 [place] Released: 08/02/2000 Document Revised: 06/23/2014 Document Reviewed: 02/02/2014 Elsevier Interactive Patient Education [employer].

## 2015-06-04 NOTE — Progress Notes (Addendum)
Subjective:  By signing my name below, I, Raven Small, attest that this documentation has been prepared under the direction and in the presence of [name], MD.  Electronically Signed: Thea Alken, ED Scribe. 05/31/2015. 12:15 PM.   Patient ID: Jill Herrera, female    DOB: 1990-08-19, 25 y.o.   MRN: [id number]  HPI Chief Complaint  Patient presents with  . Dizziness  . Nausea  . Rash    on chest   . Immunizations    if ok with illinesses flu shot     HPI Comments: Jill Herrera is a 25 y.o. female who presents to the [facility] complaining of dizziness. Pt was last seen by me 1 year ago for abnormal uterine bleeding. Hx of fibroids. Referred to OBGYN.   Pt states last night while at work she became dizzy and blacked out. She reports waking up on the ground but does not remember falling.  She states fall was witnessed by a coworker, who had told her she had passed out for 1-2 minutes but denied her having seizure like activity. She reports difficulty speaking after fall but none last night or today. Her BP yesterday after fall was 100/70. Blood sugar was not obtained. Pt denies feeling dizzy prior to yesterday. She does have some dizziness today and notes associated blurred vision with dizziness when standing and looking long distance. Pt mentions not eating as much due to stress with both school and work but tries to eat when she can.  She denies CP, palpitation, weakness, numbness, . 100/70. Pt denies hx of DM.    Pt has hx of anemia. Last HBG- 11.3 which was down from 12.0 2 months prior.  Pt did not receive flu shot this year. She states the last time she received this vaccines she had some irritation at the injection site but experiences this with most reactions. She also states she developed a cough and cold.   She was diagnosed with PCOS. She had a change in birth control and now takes microgestin every day. She takes flexeril for menstrual cramps.    She has been on clindamycin for 2 weeks for a Root canal    Pt noticed a slightly itchy rash on chest last night which has gone away.  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   Patient Active Problem List   Diagnosis Date Noted  . PCOS (polycystic ovarian syndrome) 02/05/2015   Past Medical History  Diagnosis Date  . Anemia   . Fibroid of cervix    Past Surgical History  Procedure Laterality Date  . Uterine fibroid surgery     No Known Allergies Prior to Admission medications   Medication Sig Start Date End Date Taking? Authorizing Provider  clindamycin (CLEOCIN) 150 MG capsule Take by mouth 3 (three) times daily.   Yes Historical Provider, MD  cyclobenzaprine (FLEXERIL) 10 MG tablet Take 1 tablet (10 mg total) by mouth at bedtime as needed for muscle spasms. 02/05/15  Yes Elby Beck, FNP  MICROGESTIN 1.5-30 MG-MCG tablet TK 1 T PO QD 01/23/15  Yes Historical Provider, MD   Social History   Social History  . Marital Status: Single    Spouse Name: N/A  . Number of Children: N/A  . Years of Education: N/A   Occupational History  . Not on file.   Social History Main Topics  . Smoking status: Never Smoker   . Smokeless tobacco: Not on file  . Alcohol Use: Not  on file  . Drug Use: Not on file  . Sexual Activity: Not on file   Other Topics Concern  . Not on file   Social History Narrative   Review of Systems  Constitutional: Negative for fever and chills.  Eyes: Positive for visual disturbance.  Respiratory: Negative for shortness of breath.   Cardiovascular: Negative for chest pain, palpitations and leg swelling.  Gastrointestinal: Negative for nausea, vomiting, abdominal pain, diarrhea and blood in stool.  Genitourinary: Negative for enuresis and difficulty urinating.  Neurological: Positive for dizziness and syncope. Negative for seizures, facial asymmetry, speech difficulty and weakness.    Objective:   Physical Exam  Constitutional: She is oriented to  person, place, and time. She appears well-developed and well-nourished. No distress.  HENT:  Head: Normocephalic and atraumatic.  Mouth/Throat: Oropharynx is clear and moist.  Eyes: Conjunctivae and EOM are normal. Pupils are equal, round, and reactive to light. Right eye exhibits no nystagmus. Left eye exhibits no nystagmus.  Neck: Neck supple.  Cardiovascular: Normal rate, regular rhythm and normal heart sounds.  Exam reveals no gallop and no friction rub.   No murmur heard. Pulmonary/Chest: Effort normal and breath sounds normal. She has no wheezes. She has no rales.  Abdominal: Soft. Bowel sounds are normal. There is no tenderness.  Musculoskeletal: Normal range of motion. She exhibits no edema.  Neurological: She is alert and oriented to person, place, and time. She displays a negative Romberg sign.  No pronator drift. Normal finger to nose. Normal heel to toe. No focal weakness.   Skin: Skin is warm and dry.  Very fine pap on lateral breast. No urticarial no nipple discharge.   Psychiatric: She has a normal mood and affect. Her behavior is normal.  Nursing note and vitals reviewed.  At end of visit, she did have a few patches of papules and possible urticarial lesions on her left arm and right arm.  Filed Vitals:   06/04/15 1114  BP: 118/70  Pulse: 100  Temp: 98.3 F (36.8 C)  TempSrc: Oral  Resp: 17  Height: 5' 2.5" (1.588 m)  Weight: 149 lb (67.586 kg)  SpO2: 98%  EKG: sinus rhythm, no acute findings. Results for orders placed or performed in visit on 06/04/15  POCT glucose (manual entry)  Result Value Ref Range   POC Glucose 98 70 - 99 mg/dl  POCT CBC  Result Value Ref Range   WBC 4.6 4.6 - 10.2 K/uL   Lymph, poc 2.4 0.6 - 3.4   POC LYMPH PERCENT 52.3 (A) 10 - 50 %L   MID (cbc) 0.2 0 - 0.9   POC MID % 4.9 0 - 12 %M   POC Granulocyte 2.0 2 - 6.9   Granulocyte percent 42.8 37 - 80 %G   RBC 5.02 4.04 - 5.48 M/uL   Hemoglobin 11.7 (A) 12.2 - 16.2 g/dL   HCT, POC  35.2 (A) 37.7 - 47.9 %   MCV 70.2 (A) 80 - 97 fL   MCH, POC 23.3 (A) 27 - 31.2 pg   MCHC 33.2 31.8 - 35.4 g/dL   RDW, POC 14.5 %   Platelet Count, POC 413 142 - 424 K/uL   MPV 7.9 0 - 99.8 fL  POCT urinalysis dipstick  Result Value Ref Range   Color, UA yellow yellow   Clarity, UA clear clear   Glucose, UA negative negative   Bilirubin, UA negative negative   Ketones, POC UA negative negative   Spec Grav, UA  1.020    Blood, UA trace-intact (A) negative   pH, UA 6.5    Protein Ur, POC negative negative   Urobilinogen, UA 0.2    Nitrite, UA Negative Negative   Leukocytes, UA Negative Negative   Orthostatic VS for the past 24 hrs:  BP- Lying Pulse- Lying BP- Sitting Pulse- Sitting BP- Standing at 0 minutes Pulse- Standing at 0 minutes  06/04/15 1313 124/80 mmHg 77 129/83 mmHg 74 130/85 mmHg 78     Assessment & Plan:  . Elberta Calia is a 25 y.o. female Dizziness - Plan: POCT glucose (manual entry), Basic metabolic panel, POCT CBC, POCT urinalysis dipstick, EKG 12-Lead, Orthostatic vital signs, Ambulatory referral to Cardiology, CANCELED: Ambulatory referral to Neurology  Syncope and collapse - Plan: POCT glucose (manual entry), Basic metabolic panel, POCT CBC, POCT urinalysis dipstick, EKG 12-Lead, Orthostatic vital signs, Ambulatory referral to Cardiology, CANCELED: Ambulatory referral to Neurology  Anemia, unspecified anemia type - Plan: POCT CBC  Irregular menses - Plan: POCT urine pregnancy  PCOS (polycystic ovarian syndrome) - Plan: POCT urine pregnancy  History of PCOS, slight anemia with onset of dizziness followed by syncopal episode last night lasting approximately 2 minutes. No associated seizure activity or postictal symptoms. Still some persistent dizziness today, but nonfocal neurologic exam, no acute findings on EKG or concerning lab findings.  -check BMP, refer to cardiology, but initially can try increasing fluid and calorie intake over the weekend. If any  worsening of symptoms, call 911 or ER prior to cardiology evaluation.   Rash - arms, chest. Contact derm vs urticarial.  Can try topical cortisone cream, Benadryl over-the-counter if needed, but cautioned on dizziness and sedation with Benadryl. RTC precautions if continued spread or worsening.   Patient Instructions       IF you received an x-ray today, you will receive an invoice from [facility]. Please contact [facility] at [phone] with questions or concerns regarding your invoice.   IF you received labwork today, you will receive an invoice from [employer]. Please contact [facility] at [phone] with questions or concerns regarding your invoice.   Our billing staff will not be able to assist you with questions regarding bills from these companies.  You will be contacted with the lab results as soon as they are available. The fastest way to get your results is to activate your My Chart account. Instructions are located on the last page of this paperwork. If you have not heard from [place] regarding the results in 2 weeks, please contact this office.     Your anemia is borderline today, can take iron supplement over-the-counter once per day, but this is unlikely the cause of your dizziness. Your EKG, and other tests here today were also reassuring, but I will also send off some kidney, electrolyte tests.  Increase fluid and calorie intake this weekend, but if symptoms are not improving into Monday or Tuesday, I did order a referral for cardiology evaluation. If you are dizzy or lightheaded, recommended against driving, and out of work until your symptoms have improved.  Your rash may be a contact dermatitis, or hives. See information on this below, but you can use cortisone cream over-the-counter to the itching areas, and Benadryl up to every 4-6 hours, but be careful with Benadryl as it can also cause sedation and dizziness. If continues to  spread or worsening symptoms, return for recheck.    Return to the clinic or go to the nearest emergency room if any  of your symptoms worsen or new symptoms occur.  Syncope Syncope is a medical term for fainting or passing out. This means you lose consciousness and drop to the ground. People are generally unconscious for less than 5 minutes. You may have some muscle twitches for up to 15 seconds before waking up and returning to normal. Syncope occurs more often in older adults, but it can happen to anyone. While most causes of syncope are not dangerous, syncope can be a sign of a serious medical problem. It is important to seek medical care.  CAUSES  Syncope is caused by a sudden drop in blood flow to the brain. The specific cause is often not determined. Factors that can bring on syncope include:  Taking medicines that lower blood pressure.  Sudden changes in posture, such as standing up quickly.  Taking more medicine than prescribed.  Standing in one place for too long.  Seizure disorders.  Dehydration and excessive exposure to heat.  Low blood sugar (hypoglycemia).  Straining to have a bowel movement.  Heart disease, irregular heartbeat, or other circulatory problems.  Fear, emotional distress, seeing blood, or severe pain. SYMPTOMS  Right before fainting, you may:  Feel dizzy or light-headed.  Feel nauseous.  See all white or all black in your field of vision.  Have cold, clammy skin. DIAGNOSIS  Your health care provider will ask about your symptoms, perform a physical exam, and perform an electrocardiogram (ECG) to record the electrical activity of your heart. Your health care provider may also perform other heart or blood tests to determine the cause of your syncope which may include:  Transthoracic echocardiogram (TTE). During echocardiography, sound waves are used to evaluate how blood flows through your heart.  Transesophageal echocardiogram (TEE).  Cardiac  monitoring. This allows your health care provider to monitor your heart rate and rhythm in real time.  Holter monitor. This is a portable device that records your heartbeat and can help diagnose heart arrhythmias. It allows your health care provider to track your heart activity for several days, if needed.  Stress tests by exercise or by giving medicine that makes the heart beat faster. TREATMENT  In most cases, no treatment is needed. Depending on the cause of your syncope, your health care provider may recommend changing or stopping some of your medicines. HOME CARE INSTRUCTIONS  Have someone stay with you until you feel stable.  Do not drive, use machinery, or play sports until your health care provider says it is okay.  Keep all follow-up appointments as directed by your health care provider.  Lie down right away if you start feeling like you might faint. Breathe deeply and steadily. Wait until all the symptoms have passed.  Drink enough fluids to keep your urine clear or pale yellow.  If you are taking blood pressure or heart medicine, get up slowly and take several minutes to sit and then stand. This can reduce dizziness. SEEK IMMEDIATE MEDICAL CARE IF:   You have a severe headache.  You have unusual pain in the chest, abdomen, or back.  You are bleeding from your mouth or rectum, or you have black or tarry stool.  You have an irregular or very fast heartbeat.  You have pain with breathing.  You have repeated fainting or seizure-like jerking during an episode.  You faint when sitting or lying down.  You have confusion.  You have trouble walking.  You have severe weakness.  You have vision problems. If you fainted, call your  local emergency services (911 in [place] Do not drive yourself to the hospital.    This information is not intended to replace advice given to you by your health care provider. Make sure you discuss any questions you have with your health care  provider.   Document Released: 02/06/2005 Document Revised: 06/23/2014 Document Reviewed: 04/07/2011 Elsevier Interactive Patient Education 2016 [employer] Inc[employer]  Dizziness Dizziness is a common problem. It is a feeling of unsteadiness or light-headedness. You may feel like you are about to faint. Dizziness can lead to injury if you stumble or fall. Anyone can become dizzy, but dizziness is more common in older adults. This condition can be caused by a number of things, including medicines, dehydration, or illness. HOME CARE INSTRUCTIONS Taking these steps may help with your condition: Eating and Drinking  Drink enough fluid to keep your urine clear or pale yellow. This helps to keep you from becoming dehydrated. Try to drink more clear fluids, such as water.  Do not drink alcohol.  Limit your caffeine intake if directed by your health care provider.  Limit your salt intake if directed by your health care provider. Activity  Avoid making quick movements.  Rise slowly from chairs and steady yourself until you feel okay.  In the morning, first sit up on the side of the bed. When you feel okay, stand slowly while you hold onto something until you know that your balance is fine.  Move your legs often if you need to stand in one place for a long time. Tighten and relax your muscles in your legs while you are standing.  Do not drive or operate heavy machinery if you feel dizzy.  Avoid bending down if you feel dizzy. Place items in your home so that they are easy for you to reach without leaning over. Lifestyle  Do not use any tobacco products, including cigarettes, chewing tobacco, or electronic cigarettes. If you need help quitting, ask your health care provider.  Try to reduce your stress level, such as with yoga or meditation. Talk with your health care provider if you need help. General Instructions  Watch your dizziness for any changes.  Take medicines only as directed by your  health care provider. Talk with your health care provider if you think that your dizziness is caused by a medicine that you are taking.  Tell a friend or a family member that you are feeling dizzy. If he or she notices any changes in your behavior, have this person call your health care provider.  Keep all follow-up visits as directed by your health care provider. This is important. SEEK MEDICAL CARE IF:  Your dizziness does not go away.  Your dizziness or light-headedness gets worse.  You feel nauseous.  You have reduced hearing.  You have new symptoms.  You are unsteady on your feet or you feel like the room is spinning. SEEK IMMEDIATE MEDICAL CARE IF:  You vomit or have diarrhea and are unable to eat or drink anything.  You have problems talking, walking, swallowing, or using your arms, hands, or legs.  You feel generally weak.  You are not thinking clearly or you have trouble forming sentences. It may take a friend or family member to notice this.  You have chest pain, abdominal pain, shortness of breath, or sweating.  Your vision changes.  You notice any bleeding.  You have a headache.  You have neck pain or a stiff neck.  You have a fever.   This  information is not intended to replace advice given to you by your health care provider. Make sure you discuss any questions you have with your health care provider.  Hives Hives are itchy, red, swollen areas of the skin. They can vary in size and location on your body. Hives can come and go for hours or several days (acute hives) or for several weeks (chronic hives). Hives do not spread from person to person (noncontagious). They may get worse with scratching, exercise, and emotional stress. CAUSES   Allergic reaction to food, additives, or drugs.  Infections, including the common cold.  Illness, such as vasculitis, lupus, or thyroid disease.  Exposure to sunlight, heat, or cold.  Exercise.  Stress.  Contact  with chemicals. SYMPTOMS   Red or white swollen patches on the skin. The patches may change size, shape, and location quickly and repeatedly.  Itching.  Swelling of the hands, feet, and face. This may occur if hives develop deeper in the skin. DIAGNOSIS  Your caregiver can usually tell what is wrong by performing a physical exam. Skin or blood tests may also be done to determine the cause of your hives. In some cases, the cause cannot be determined. TREATMENT  Mild cases usually get better with medicines such as antihistamines. Severe cases may require an emergency epinephrine injection. If the cause of your hives is known, treatment includes avoiding that trigger.  HOME CARE INSTRUCTIONS   Avoid causes that trigger your hives.  Take antihistamines as directed by your caregiver to reduce the severity of your hives. Non-sedating or low-sedating antihistamines are usually recommended. Do not drive while taking an antihistamine.  Take any other medicines prescribed for itching as directed by your caregiver.  Wear loose-fitting clothing.  Keep all follow-up appointments as directed by your caregiver. SEEK MEDICAL CARE IF:   You have persistent or severe itching that is not relieved with medicine.  You have painful or swollen joints. SEEK IMMEDIATE MEDICAL CARE IF:   You have a fever.  Your tongue or lips are swollen.  You have trouble breathing or swallowing.  You feel tightness in the throat or chest.  You have abdominal pain. These problems may be the first sign of a life-threatening allergic reaction. Call your local emergency services (911 in [place] MAKE SURE YOU:   Understand these instructions.  Will watch your condition.  Will get help right away if you are not doing well or get worse.   This information is not intended to replace advice given to you by your health care provider. Make sure you discuss any questions you have with your health care provider.    Document Released: 02/06/2005 Document Revised: 02/11/2013 Document Reviewed: 05/02/2011 Elsevier Interactive Patient Education 2016 [employer] Dermatitis Dermatitis is redness, soreness, and swelling (inflammation) of the skin. Contact dermatitis is a reaction to certain substances that touch the skin. There are two types of contact dermatitis:   Irritant contact dermatitis. This type is caused by something that irritates your skin, such as dry hands from washing them too much. This type does not require previous exposure to the substance for a reaction to occur. This type is more common.  Allergic contact dermatitis. This type is caused by a substance that you are allergic to, such as a nickel allergy or poison ivy. This type only occurs if you have been exposed to the substance (allergen) before. Upon a repeat exposure, your body reacts to the substance. This type is less common. CAUSES  Many different substances can cause contact dermatitis. Irritant contact dermatitis is most commonly caused by exposure to:   Makeup.   Soaps.   Detergents.   Bleaches.   Acids.   Metal salts, such as nickel.  Allergic contact dermatitis is most commonly caused by exposure to:   Poisonous plants.   Chemicals.   Jewelry.   Latex.   Medicines.   Preservatives in products, such as clothing.  RISK FACTORS This condition is more likely to develop in:   People who have jobs that expose them to irritants or allergens.  People who have certain medical conditions, such as asthma or eczema.  SYMPTOMS  Symptoms of this condition may occur anywhere on your body where the irritant has touched you or is touched by you. Symptoms include:  Dryness or flaking.   Redness.   Cracks.   Itching.   Pain or a burning feeling.   Blisters.  Drainage of small amounts of blood or clear fluid from skin cracks. With allergic contact dermatitis, there may also be swelling  in areas such as the eyelids, mouth, or genitals.  DIAGNOSIS  This condition is diagnosed with a medical history and physical exam. A patch skin test may be performed to help determine the cause. If the condition is related to your job, you may need to see an occupational medicine specialist. TREATMENT Treatment for this condition includes figuring out what caused the reaction and protecting your skin from further contact. Treatment may also include:   Steroid creams or ointments. Oral steroid medicines may be needed in more severe cases.  Antibiotics or antibacterial ointments, if a skin infection is present.  Antihistamine lotion or an antihistamine taken by mouth to ease itching.  A bandage (dressing). HOME CARE INSTRUCTIONS Skin Care  Moisturize your skin as needed.   Apply cool compresses to the affected areas.  Try taking a bath with:  Epsom salts. Follow the instructions on the packaging. You can get these at your local pharmacy or grocery store.  Baking soda. Pour a small amount into the bath as directed by your health care provider.  Colloidal oatmeal. Follow the instructions on the packaging. You can get this at your local pharmacy or grocery store.  Try applying baking soda paste to your skin. Stir water into baking soda until it reaches a paste-like consistency.  Do not scratch your skin.  Bathe less frequently, such as every other day.  Bathe in lukewarm water. Avoid using hot water. Medicines  Take or apply over-the-counter and prescription medicines only as told by your health care provider.   If you were prescribed an antibiotic medicine, take or apply your antibiotic as told by your health care provider. Do not stop using the antibiotic even if your condition starts to improve. General Instructions  Keep all follow-up visits as told by your health care provider. This is important.  Avoid the substance that caused your reaction. If you do not know what  caused it, keep a journal to try to track what caused it. Write down:  What you eat.  What cosmetic products you use.  What you drink.  What you wear in the affected area. This includes jewelry.  If you were given a dressing, take care of it as told by your health care provider. This includes when to change and remove it. SEEK MEDICAL CARE IF:   Your condition does not improve with treatment.  Your condition gets worse.  You have signs of infection such  as swelling, tenderness, redness, soreness, or warmth in the affected area.  You have a fever.  You have new symptoms. SEEK IMMEDIATE MEDICAL CARE IF:   You have a severe headache, neck pain, or neck stiffness.  You vomit.  You feel very sleepy.  You notice red streaks coming from the affected area.  Your bone or joint underneath the affected area becomes painful after the skin has healed.  The affected area turns darker.  You have difficulty breathing.   This information is not intended to replace advice given to you by your health care provider. Make sure you discuss any questions you have with your health care provider.   Document Released: 02/04/2000 Document Revised: 10/28/2014 Document Reviewed: 06/24/2014 Elsevier Interactive Patient Education 2016 [place] Released: 08/02/2000 Document Revised: 06/23/2014 Document Reviewed: 02/02/2014 Elsevier Interactive Patient Education [employer].      I personally performed the services described in this documentation, which was scribed in my presence. The recorded information has been reviewed and considered, and addended by me as needed.

## 2015-06-05 LAB — BASIC METABOLIC PANEL
BUN: 8 mg/dL (ref 7–25)
CHLORIDE: 104 mmol/L (ref 98–110)
CO2: 25 mmol/L (ref 20–31)
CREATININE: 0.62 mg/dL (ref 0.50–1.10)
Calcium: 9.5 mg/dL (ref 8.6–10.2)
Glucose, Bld: 81 mg/dL (ref 65–99)
Potassium: 4.2 mmol/L (ref 3.5–5.3)
Sodium: 136 mmol/L (ref 135–146)

## 2015-12-16 ENCOUNTER — Ambulatory Visit (INDEPENDENT_AMBULATORY_CARE_PROVIDER_SITE_OTHER): Payer: BLUE CROSS/BLUE SHIELD | Admitting: Family Medicine

## 2015-12-16 VITALS — BP 110/70 | HR 80 | Temp 98.3°F | Resp 18 | Ht 62.5 in | Wt 151.0 lb

## 2015-12-16 DIAGNOSIS — J069 Acute upper respiratory infection, unspecified: Secondary | ICD-10-CM | POA: Diagnosis not present

## 2015-12-16 DIAGNOSIS — B9789 Other viral agents as the cause of diseases classified elsewhere: Secondary | ICD-10-CM | POA: Diagnosis not present

## 2015-12-16 MED ORDER — ALBUTEROL SULFATE HFA 108 (90 BASE) MCG/ACT IN AERS: 2.0000 | INHALATION_SPRAY | Freq: Four times a day (QID) | RESPIRATORY_TRACT | 0 refills | Status: DC | PRN

## 2015-12-16 MED ORDER — CETIRIZINE HCL 10 MG PO TABS
10.0000 mg | ORAL_TABLET | Freq: Every day | ORAL | 11 refills | Status: DC
Start: 2015-12-16 — End: 2016-10-26

## 2015-12-16 MED ORDER — GUAIFENESIN-CODEINE 200-10 MG/5ML PO LIQD: 5.0000 mL | Freq: Three times a day (TID) | ORAL | 0 refills | Status: DC | PRN

## 2015-12-16 NOTE — Patient Instructions (Addendum)
You have a cold which is caused a cough. You should get better in the next several days.  You do have some asthma-like symptoms in her lungs. Albuterol will help with this. Use this every 4-6 hours as needed for the next several days.  Take the cetirizine which is an allergy medicine to help open up her nose and breathe better. This is over-the-counter.  The prescription cough syrup has codeine in it which may make you little sleepy.  If it does do not use this during the day.    If you're not better in the next week come back and see [place]!    IF you received an x-ray today, you will receive an invoice from [facility]. Please contact [facility] at [phone] with questions or concerns regarding your invoice.   IF you received labwork today, you will receive an invoice from [employer]. Please contact [facility] at [phone] with questions or concerns regarding your invoice.   Our billing staff will not be able to assist you with questions regarding bills from these companies.  You will be contacted with the lab results as soon as they are available. The fastest way to get your results is to activate your My Chart account. Instructions are located on the last page of this paperwork. If you have not heard from [place] regarding the results in 2 weeks, please contact this office.

## 2015-12-16 NOTE — Progress Notes (Signed)
   Jill Herrera is a 25 y.o. female who presents to [facility] today for cough:  1.  Cough and URI symptoms:  SUBJECTIVE: URI symptoms:  Jill Herrera is a 25 y.o. female who complains of URI symptoms present for past 3 days.  Describes rhinorrhea, sinus congestion, mild cough.  Has tried no OTC meds. Has used albuterol in past for asthma like symptoms, but never diagnosed..  Sick contacts are coworkers.  No fevers or chills. No nausea or vomiting.  Denies smoking cigarettes.  She has past history significant for seasonal allergies and anemia treated with iron.  She has had fibroid surgery in the past.  Family history: Father is healthy. Mother has history of hypertension and diabetes.  Social history: She is a never smoker denies any alcohol or illicit drug use.  OBJECTIVE: BP 110/70   Pulse 80   Temp 98.3 F (36.8 C) (Oral)   Resp 18   Ht 5' 2.5" (1.588 m)   Wt 151 lb (68.5 kg)   LMP 10/02/2015   SpO2 99%   BMI 27.18 kg/m  Gen:  Patient sitting on exam table, appears stated age in no acute distress Head: Normocephalic atraumatic Eyes: EOMI, PERRL, sclera and conjunctiva non-erythematous Ears:  Canals clear bilaterally.  TMs pearly gray bilaterally without erythema or bulging.   Nose:  Nasal turbinates grossly enlarged bilaterally. Some exudates noted. Tender to palpation of maxillary sinus  Mouth: Mucosa membranes moist. Tonsils +2, nonenlarged, non-erythematous.  Throat and pharynx normal appearing. Neck: No cervical lymphadenopathy noted Heart:  RRR, no murmurs auscultated. Pulm:  Some mild wheezing at bases BL.    Imp/plan 1.  Viral URI with cough:  - Likely viral illness based on symptoms and history.  No signs of bacterial illness. Symptomatic treatment for now, see instructions.  Prescription cough syrup for cough that's worse at night. Does have some mild wheezing.  Should return in 4 - 6 weeks for PFTs.  Albuterol refill in meantime.    Return if worsening or no improvement in 1 week.

## 2015-12-23 DIAGNOSIS — R875 Abnormal microbiological findings in specimens from female genital organs: Secondary | ICD-10-CM | POA: Diagnosis not present

## 2015-12-23 DIAGNOSIS — N913 Primary oligomenorrhea: Secondary | ICD-10-CM | POA: Diagnosis not present

## 2015-12-23 DIAGNOSIS — Z01419 Encounter for gynecological examination (general) (routine) without abnormal findings: Secondary | ICD-10-CM | POA: Diagnosis not present

## 2015-12-23 DIAGNOSIS — Z6828 Body mass index (BMI) 28.0-28.9, adult: Secondary | ICD-10-CM | POA: Diagnosis not present

## 2015-12-23 LAB — HM PAP SMEAR: HM Pap smear: NEGATIVE

## 2016-01-17 DIAGNOSIS — N854 Malposition of uterus: Secondary | ICD-10-CM | POA: Diagnosis not present

## 2016-01-17 DIAGNOSIS — Z30430 Encounter for insertion of intrauterine contraceptive device: Secondary | ICD-10-CM | POA: Diagnosis not present

## 2016-02-22 DIAGNOSIS — Z30431 Encounter for routine checking of intrauterine contraceptive device: Secondary | ICD-10-CM | POA: Diagnosis not present

## 2016-10-26 ENCOUNTER — Encounter: Payer: Self-pay | Admitting: Physician Assistant

## 2016-10-26 ENCOUNTER — Ambulatory Visit (INDEPENDENT_AMBULATORY_CARE_PROVIDER_SITE_OTHER): Payer: BLUE CROSS/BLUE SHIELD | Admitting: Physician Assistant

## 2016-10-26 VITALS — BP 120/80 | HR 71 | Temp 99.5°F | Ht 62.25 in | Wt 158.0 lb

## 2016-10-26 DIAGNOSIS — J029 Acute pharyngitis, unspecified: Secondary | ICD-10-CM | POA: Diagnosis not present

## 2016-10-26 LAB — POCT RAPID STREP A (OFFICE): Rapid Strep A Screen: NEGATIVE

## 2016-10-26 MED ORDER — PREDNISONE 20 MG PO TABS: 20.0000 mg | ORAL_TABLET | Freq: Every day | ORAL | 0 refills | Status: DC

## 2016-10-26 NOTE — Progress Notes (Signed)
Jill Herrera is a 26 y.o. female here to [place] and having sore throat and left ear pain.  I acted as a Education administrator for [employer], PA-C Anselmo Pickler, LPN  History of Present Illness:   Chief Complaint  Patient presents with  . Establish Care    BC/BS  . Sore Throat    started Tues  . Left ear pain    Acute Concerns: Sore throat and L ear pain -- she went to a wedding over the weekend and since then has had sore throat, low grade fever, L ear pain and today has slight cough. She has been taking Nyquil and Tylenol without much relief. Hx of asthma with illnesses, typically receives an albuterol inhaler, but has not had any difficulty breathing or pain. Measured temperature at home is 99.5. Not drinking enough fluids per her report. Appetite diminished with some intermittent nausea. No hx of PNA.   Chronic Issues: None  Health Maintenance: Immunizations -- up to date, will return for flu PAP -- 12/2015 - normal Weight -- Weight: 158 lb (71.7 kg)   Depression screen PHQ 2/9 10/26/2016  Decreased Interest 0  Down, Depressed, Hopeless 0  PHQ - 2 Score 0    No flowsheet data found.  Other providers/specialists: [facility] -- Dr. Dellis Filbert Eye doctor -- Dr. Luretha Rued  Past Medical History:  Diagnosis Date  . Anemia   . Fibroid of cervix      Social History   Social History  . Marital status: Single    Spouse name: N/A  . Number of children: N/A  . Years of education: N/A   Occupational History  . Not on file.   Social History Main Topics  . Smoking status: Never Smoker  . Smokeless tobacco: Never Used  . Alcohol use 0.6 oz/week    1 Glasses of wine per week  . Drug use: No  . Sexual activity: Yes    Birth control/ protection: IUD   Other Topics Concern  . Not on file   Social History Narrative   Memory Care Coordinator for [employer]   Lives in [place]   In a relationship    Past Surgical History:  Procedure Laterality Date  . ROBOT  ASSISTED MYOMECTOMY N/A 06/2011  . UTERINE FIBROID SURGERY      Family History  Problem Relation Age of Onset  . Asthma Mother   . Diabetes Mother   . Miscarriages / [place] Mother     No Known Allergies   Current Medications:   Current Outpatient Prescriptions:  .  albuterol (PROVENTIL HFA;VENTOLIN HFA) 108 (90 Base) MCG/ACT inhaler, Inhale 2 puffs into the lungs every 6 (six) hours as needed for wheezing or shortness of breath., Disp: 1 Inhaler, Rfl: 0 .  levonorgestrel (MIRENA) 20 MCG/24HR IUD, 1 each by Intrauterine route once. Inserted 12/2015, needs to be removed 12/2021., Disp: , Rfl:  .  predniSONE (DELTASONE) 20 MG tablet, Take 1 tablet (20 mg total) by mouth daily with breakfast., Disp: 5 tablet, Rfl: 0   Review of Systems:   Review of Systems  Constitutional: Positive for fever. Negative for chills, malaise/fatigue and weight loss.  Eyes: Negative for blurred vision and double vision.  Respiratory: Positive for cough and sputum production. Negative for shortness of breath and wheezing.   Cardiovascular: Negative for chest pain, orthopnea, claudication and leg swelling.  Gastrointestinal: Positive for nausea. Negative for abdominal pain, constipation, diarrhea, heartburn and vomiting.  Neurological: Positive for headaches. Negative for  dizziness and tingling.    Vitals:   Vitals:   10/26/16 1119  BP: 120/80  Pulse: 71  Temp: 99.5 F (37.5 C)  TempSrc: Oral  SpO2: 98%  Weight: 158 lb (71.7 kg)  Height: 5' 2.25" (1.581 m)     Body mass index is 28.67 kg/m.  Physical Exam:   Physical Exam  Constitutional: She appears well-developed. She is cooperative.  Non-toxic appearance. She does not have a sickly appearance. She does not appear ill. No distress.  HENT:  Head: Normocephalic and atraumatic.  Right Ear: Tympanic membrane, external ear and ear canal normal. Tympanic membrane is not erythematous, not retracted and not bulging.  Left Ear: Tympanic  membrane, external ear and ear canal normal. Tympanic membrane is not erythematous, not retracted and not bulging.  Nose: Mucosal edema and rhinorrhea present. Right sinus exhibits no maxillary sinus tenderness and no frontal sinus tenderness. Left sinus exhibits no maxillary sinus tenderness and no frontal sinus tenderness.  Mouth/Throat: Uvula is midline. Mucous membranes are dry. Posterior oropharyngeal erythema present. No posterior oropharyngeal edema. Tonsils are 1+ on the right. Tonsils are 1+ on the left. No tonsillar exudate.  Eyes: Conjunctivae and lids are normal.  Neck: Trachea normal.  Cardiovascular: Normal rate, regular rhythm, S1 normal, S2 normal and normal heart sounds.   Pulmonary/Chest: Effort normal and breath sounds normal. She has no decreased breath sounds. She has no wheezes. She has no rhonchi. She has no rales.  Lymphadenopathy:    She has no cervical adenopathy.  Neurological: She is alert.  Skin: Skin is warm, dry and intact.  Psychiatric: She has a normal mood and affect. Her speech is normal and behavior is normal.  Nursing note and vitals reviewed.  Results for orders placed or performed in visit on 10/26/16  POCT rapid strep A  Result Value Ref Range   Rapid Strep A Screen Negative Negative    Assessment and Plan:    Carren was seen today for establish care, sore throat and left ear pain.  Diagnoses and all orders for this visit:  Viral pharyngitis Rapid strep test negative. Suspect viral pharyngitis. May continue Tylenol. Start low dose 20 mg Prednisone daily x 5 days. Follow-up if symptoms worsen or persist. No indication for antibiotics as this time. Return to clinic precautions given and reviewed with patient. She declined a refill on her albuterol inhaler today -- no evidence of wheezing on my exam. -     POCT rapid strep A  Other orders -     predniSONE (DELTASONE) 20 MG tablet; Take 1 tablet (20 mg total) by mouth daily with  breakfast.    . Reviewed expectations re: course of current medical issues. . Discussed self-management of symptoms. . Outlined signs and symptoms indicating need for more acute intervention. . Patient verbalized understanding and all questions were answered. . See orders for this visit as documented in the electronic medical record. . Patient received an After-Visit Summary.   CMA or LPN served as scribe during this visit. History, Physical, and Plan performed by medical provider. Documentation and orders reviewed and attested to.   Inda Coke, PA-C

## 2016-10-26 NOTE — Patient Instructions (Signed)
It was great to meet you!  Take the prednisone daily for 5 days -- take with food.  Stay hydrated, drink enough fluids to keep your urine clear.  Keep [place] posted if you begin to feel worse or develop fever >100.5 or shortness of breath.  Pharyngitis Pharyngitis is redness, pain, and swelling (inflammation) of your pharynx. What are the causes? Pharyngitis is usually caused by infection. Most of the time, these infections are from viruses (viral) and are part of a cold. However, sometimes pharyngitis is caused by bacteria (bacterial). Pharyngitis can also be caused by allergies. Viral pharyngitis may be spread from person to person by coughing, sneezing, and personal items or utensils (cups, forks, spoons, toothbrushes). Bacterial pharyngitis may be spread from person to person by more intimate contact, such as kissing. What are the signs or symptoms? Symptoms of pharyngitis include:  Sore throat.  Tiredness (fatigue).  Low-grade fever.  Headache.  Joint pain and muscle aches.  Skin rashes.  Swollen lymph nodes.  Plaque-like film on throat or tonsils (often seen with bacterial pharyngitis).  How is this diagnosed? Your health care provider will ask you questions about your illness and your symptoms. Your medical history, along with a physical exam, is often all that is needed to diagnose pharyngitis. Sometimes, a rapid strep test is done. Other lab tests may also be done, depending on the suspected cause. How is this treated? Viral pharyngitis will usually get better in 3-4 days without the use of medicine. Bacterial pharyngitis is treated with medicines that kill germs (antibiotics). Follow these instructions at home:  Drink enough water and fluids to keep your urine clear or pale yellow.  Only take over-the-counter or prescription medicines as directed by your health care provider: ? If you are prescribed antibiotics, make sure you finish them even if you start to feel  better. ? Do not take aspirin.  Get lots of rest.  Gargle with 8 oz of salt water ( tsp of salt per 1 qt of water) as often as every 1-2 hours to soothe your throat.  Throat lozenges (if you are not at risk for choking) or sprays may be used to soothe your throat. Contact a health care provider if:  You have large, tender lumps in your neck.  You have a rash.  You cough up green, yellow-brown, or bloody spit. Get help right away if:  Your neck becomes stiff.  You drool or are unable to swallow liquids.  You vomit or are unable to keep medicines or liquids down.  You have severe pain that does not go away with the use of recommended medicines.  You have trouble breathing (not caused by a stuffy nose). This information is not intended to replace advice given to you by your health care provider. Make sure you discuss any questions you have with your health care provider. Document Released: 02/06/2005 Document Revised: 07/15/2015 Document Reviewed: 10/14/2012 Elsevier Interactive Patient Education  2017 [employer].

## 2016-11-03 ENCOUNTER — Telehealth: Payer: Self-pay | Admitting: Physician Assistant

## 2016-11-03 NOTE — Telephone Encounter (Signed)
ROI faxed to [facility]

## 2016-11-07 ENCOUNTER — Encounter: Payer: Self-pay | Admitting: Physician Assistant

## 2017-01-08 NOTE — Telephone Encounter (Signed)
No records from [place]

## 2017-02-01 ENCOUNTER — Ambulatory Visit (INDEPENDENT_AMBULATORY_CARE_PROVIDER_SITE_OTHER): Payer: BLUE CROSS/BLUE SHIELD | Admitting: Obstetrics & Gynecology

## 2017-02-01 ENCOUNTER — Encounter: Payer: Self-pay | Admitting: Obstetrics & Gynecology

## 2017-02-01 VITALS — BP 128/84 | Ht 62.0 in | Wt 155.0 lb

## 2017-02-01 DIAGNOSIS — D259 Leiomyoma of uterus, unspecified: Secondary | ICD-10-CM | POA: Diagnosis not present

## 2017-02-01 DIAGNOSIS — Z975 Presence of (intrauterine) contraceptive device: Secondary | ICD-10-CM | POA: Diagnosis not present

## 2017-02-01 DIAGNOSIS — N945 Secondary dysmenorrhea: Secondary | ICD-10-CM | POA: Diagnosis not present

## 2017-02-01 DIAGNOSIS — Z01419 Encounter for gynecological examination (general) (routine) without abnormal findings: Secondary | ICD-10-CM

## 2017-02-01 DIAGNOSIS — N921 Excessive and frequent menstruation with irregular cycle: Secondary | ICD-10-CM

## 2017-02-01 DIAGNOSIS — R87610 Atypical squamous cells of undetermined significance on cytologic smear of cervix (ASC-US): Secondary | ICD-10-CM | POA: Diagnosis not present

## 2017-02-01 MED ORDER — NORETHIN ACE-ETH ESTRAD-FE 1-20 MG-MCG(24) PO TABS: 1.0000 | ORAL_TABLET | Freq: Every day | ORAL | 0 refills | Status: DC

## 2017-02-01 NOTE — Addendum Note (Signed)
Addended by: Thurnell Garbe A on: 02/01/2017 04:49 PM   Modules accepted: Orders

## 2017-02-01 NOTE — Patient Instructions (Signed)
1. Encounter for routine gynecological examination with Papanicolaou smear of cervix Normal gynecologic exam.  Pap reflex done.  Breast exam normal.  2. IUD contraception Well on Marinol IUD but with prolonged episodes of bleeding and dysmenorrhea in the past 6 months.  IUD strings in good position.  Patient will follow up for a pelvic ultrasound to evaluate her intra-uterine cavity.  History of robotic myomectomies. - US Transvaginal Non-OB; Future   3. Menometrorrhagia Rule out endometrial/intra-uterine pathology.  History of uterine fibroids.  Will continue with Mirena IUD but will add an estrogen progestin low dosage birth control pill for 3 packs to control menometrorrhagia. - US Transvaginal Non-OB; Future  4. Secondary dysmenorrhea Follow-up with pelvic ultrasound to evaluate the uterus and ovaries. - US Transvaginal Non-OB; Future  5. Uterine leiomyoma, unspecified location History of robotic myomectomies.  Will reevaluate uterine fibroids by pelvic ultrasound.  Other orders - Norethindrone Acetate-Ethinyl Estrad-FE (LOESTRIN 24 FE) 1-20 MG-MCG(24) tablet; Take 1 tablet by mouth daily.  Jill Herrera, it was a pleasure seeing you today!  I will inform you of your results as soon as available.  See you again soon for the pelvic ultrasound.

## 2017-02-01 NOTE — Progress Notes (Signed)
Jill Herrera 04/05/1990 [id number]   History:    26 y.o. G0 Engaged.  From [place].  Will get married traditionally in [place].  RP:  Established patient presenting for annual gyn exam   HPI: On Mirena IUD for more than a year.  At first patient had no vaginal bleeding on it.  For the last 6 months, worsening menometrorrhagia with dysmenorrhea.  Has bleeding episodes every month to month and a half.  When starts bleeding usually last for 2 weeks.  Starts having uterine cramps 1 week before she starts bleeding.  History of robotic myomectomies in 2013.  Breasts normal.  Urine and bowel movements normal.  Past medical history,surgical history, family history and social history were all reviewed and documented in the EPIC chart.  Gynecologic History No LMP recorded. Patient is not currently having periods (Reason: IUD). Contraception: Mirena IUD x >1 yr Last Pap: 2017. Results were: normal Last mammogram: Never  Obstetric History OB History  Gravida Para Term Preterm AB Living  0 0 0 0 0 0  SAB TAB Ectopic Multiple Live Births  0 0 0 0 0         ROS: A ROS was performed and pertinent positives and negatives are included in the history.  GENERAL: No fevers or chills. HEENT: No change in vision, no earache, sore throat or sinus congestion. NECK: No pain or stiffness. CARDIOVASCULAR: No chest pain or pressure. No palpitations. PULMONARY: No shortness of breath, cough or wheeze. GASTROINTESTINAL: No abdominal pain, nausea, vomiting or diarrhea, melena or bright red blood per rectum. GENITOURINARY: No urinary frequency, urgency, hesitancy or dysuria. MUSCULOSKELETAL: No joint or muscle pain, no back pain, no recent trauma. DERMATOLOGIC: No rash, no itching, no lesions. ENDOCRINE: No polyuria, polydipsia, no heat or cold intolerance. No recent change in weight. HEMATOLOGICAL: No anemia or easy bruising or bleeding. NEUROLOGIC: No headache, seizures, numbness, tingling or weakness.  PSYCHIATRIC: No depression, no loss of interest in normal activity or change in sleep pattern.     Exam:   BP 128/84   Ht 5\' 2"  (1.575 m)   Wt 155 lb (70.3 kg)   BMI 28.35 kg/m   Body mass index is 28.35 kg/m.  General appearance : Well developed well nourished female. No acute distress HEENT: Eyes: no retinal hemorrhage or exudates,  Neck supple, trachea midline, no carotid bruits, no thyroidmegaly Lungs: Clear to auscultation, no rhonchi or wheezes, or rib retractions  Heart: Regular rate and rhythm, no murmurs or gallops Breast:Examined in sitting and supine position were symmetrical in appearance, no palpable masses or tenderness,  no skin retraction, no nipple inversion, no nipple discharge, no skin discoloration, no axillary or supraclavicular lymphadenopathy Abdomen: no palpable masses or tenderness, no rebound or guarding Extremities: no edema or skin discoloration or tenderness  Pelvic: Vulva normal  Bartholin, Urethra, Skene Glands: Within normal limits             Vagina: No gross lesions or discharge  Cervix: No gross lesions or discharge.  IUD strings visible.  Pap reflex done.  Uterus  AV, normal size, shape and consistency, non-tender and mobile  Adnexa  Without masses or tenderness  Anus and perineum  normal    Assessment/Plan:  26 y.o. female for annual exam   1. Encounter for routine gynecological examination with Papanicolaou smear of cervix Normal gynecologic exam.  Pap reflex done.  Breast exam normal.  2. IUD contraception Well on Marinol IUD but with prolonged episodes of bleeding  and dysmenorrhea in the past 6 months.  IUD strings in good position.  Patient will follow up for a pelvic ultrasound to evaluate her intra-uterine cavity.  History of robotic myomectomies. - US Transvaginal Non-OB; Future   3. Menometrorrhagia Rule out endometrial/intra-uterine pathology.  History of uterine fibroids.  Will continue with Mirena IUD but will add an estrogen  progestin low dosage birth control pill for 3 packs to control menometrorrhagia. - US Transvaginal Non-OB; Future  4. Secondary dysmenorrhea Follow-up with pelvic ultrasound to evaluate the uterus and ovaries. - US Transvaginal Non-OB; Future  5. Uterine leiomyoma, unspecified location History of robotic myomectomies.  Will reevaluate uterine fibroids by pelvic ultrasound.  Other orders - Norethindrone Acetate-Ethinyl Estrad-FE (LOESTRIN 24 FE) 1-20 MG-MCG(24) tablet; Take 1 tablet by mouth daily.  Counseling on above issues more than 50% for 10 minutes.  Princess Bruins MD, 3:28 PM 02/01/2017

## 2017-02-06 LAB — PAP IG W/ RFLX HPV ASCU

## 2017-02-06 LAB — HUMAN PAPILLOMAVIRUS, HIGH RISK: HPV DNA HIGH RISK: NOT DETECTED

## 2017-02-26 ENCOUNTER — Other Ambulatory Visit: Payer: Self-pay

## 2017-03-08 ENCOUNTER — Ambulatory Visit (INDEPENDENT_AMBULATORY_CARE_PROVIDER_SITE_OTHER): Payer: BLUE CROSS/BLUE SHIELD

## 2017-03-08 ENCOUNTER — Encounter: Payer: Self-pay | Admitting: Obstetrics & Gynecology

## 2017-03-08 ENCOUNTER — Ambulatory Visit: Payer: BLUE CROSS/BLUE SHIELD | Admitting: Obstetrics & Gynecology

## 2017-03-08 VITALS — BP 118/70

## 2017-03-08 DIAGNOSIS — N921 Excessive and frequent menstruation with irregular cycle: Secondary | ICD-10-CM

## 2017-03-08 DIAGNOSIS — N945 Secondary dysmenorrhea: Secondary | ICD-10-CM

## 2017-03-08 DIAGNOSIS — Z975 Presence of (intrauterine) contraceptive device: Secondary | ICD-10-CM | POA: Diagnosis not present

## 2017-03-08 MED ORDER — NORETHIN ACE-ETH ESTRAD-FE 1-20 MG-MCG(24) PO TABS: 1.0000 | ORAL_TABLET | Freq: Every day | ORAL | 4 refills | Status: DC

## 2017-03-08 NOTE — Progress Notes (Signed)
    Jill Herrera 05/15/1990 [id number]        27 y.o.  G0 Engaged.  Getting married in [place].  Going to [place] 05/2017  RP:  Pelvic Pain with Menometro for Pelvic US  HPI:  No change x Gyn Annual exam 02/01/2017:  On Mirena IUD for more than a year.  At first patient had no vaginal bleeding on it.  For the last 6 months, worsening menometrorrhagia with dysmenorrhea.  Has bleeding episodes every month, to month and a half.  When starts bleeding usually last for 2 weeks.  Starts having uterine cramps 1 week before she starts bleeding.  History of robotic myomectomy in 2013.  Past medical history,surgical history, problem list, medications, allergies, family history and social history were all reviewed and documented in the EPIC chart.  Directed ROS with pertinent positives and negatives documented in the history of present illness/assessment and plan.  Exam:  Vitals:   03/08/17 1543  BP: 118/70   General appearance:  Normal  Pelvic US today: T/V images.  Uterus anteverted and homogeneous measuring 7.84 x 5.41 x 3.78 cm.  Endometrial lining seen normal at 1.4 mm.  IUD in normal intrauterine position.  Right ovarian corpus luteum cyst measuring 2.1 x 1.8 cm.  Color flow Doppler present at the periphery.  Left ovary normal.  No apparent mass in the right and left adnexa.  No free fluid in posterior cul-de-sac.   Assessment/Plan:  27 y.o. G0  1. Menometrorrhagia Menometrorrhagia on Mirena IUD.  Per pelvic ultrasound today uterus is normal with a very thin endometrial lining at 1.4 mm.  No recurrence of uterine fibroids post myomectomy in 2013.  The Marinol IUD is in good intrauterine position.  Patient is reassured.  Decision to control her prolonged bleeding episodes with the generic of Loestrin 20 4FE.  Patient will add this low birth control pill to the Mirena IUD for a few months.  When ready, she will stop the birth control pill and see how she does on the Mirena IUD only.  2. Secondary  dysmenorrhea As above will attempt control by adding a low dose birth control pill.  Other orders - Norethindrone Acetate-Ethinyl Estrad-FE (LOESTRIN 24 FE) 1-20 MG-MCG(24) tablet; Take 1 tablet by mouth daily.  Counseling on above issues more than 50% for 15 minutes.  Princess Bruins MD, 3:57 PM 03/08/2017

## 2017-03-11 ENCOUNTER — Encounter: Payer: Self-pay | Admitting: Obstetrics & Gynecology

## 2017-03-11 NOTE — Patient Instructions (Signed)
1. Menometrorrhagia Menometrorrhagia on Mirena IUD.  Per pelvic ultrasound today uterus is normal with a very thin endometrial lining at 1.4 mm.  No recurrence of uterine fibroids post myomectomy in 2013.  The Marinol IUD is in good intrauterine position.  Patient is reassured.  Decision to control her prolonged bleeding episodes with the generic of Loestrin 20 4FE.  Patient will add this low birth control pill to the Mirena IUD for a few months.  When ready, she will stop the birth control pill and see how she does on the Mirena IUD only.  2. Secondary dysmenorrhea As above will attempt control by adding a low dose birth control pill.  Other orders - Norethindrone Acetate-Ethinyl Estrad-FE (LOESTRIN 24 FE) 1-20 MG-MCG(24) tablet; Take 1 tablet by mouth daily.  [place], good seeing you today!

## 2017-03-27 ENCOUNTER — Encounter: Payer: Self-pay | Admitting: Family Medicine

## 2017-03-27 ENCOUNTER — Ambulatory Visit: Payer: BLUE CROSS/BLUE SHIELD | Admitting: Family Medicine

## 2017-03-27 VITALS — BP 118/68 | HR 116 | Temp 102.8°F | Ht 62.0 in | Wt 153.8 lb

## 2017-03-27 DIAGNOSIS — R05 Cough: Secondary | ICD-10-CM

## 2017-03-27 DIAGNOSIS — R509 Fever, unspecified: Secondary | ICD-10-CM | POA: Diagnosis not present

## 2017-03-27 DIAGNOSIS — H6690 Otitis media, unspecified, unspecified ear: Secondary | ICD-10-CM | POA: Diagnosis not present

## 2017-03-27 DIAGNOSIS — J029 Acute pharyngitis, unspecified: Secondary | ICD-10-CM

## 2017-03-27 DIAGNOSIS — R059 Cough, unspecified: Secondary | ICD-10-CM

## 2017-03-27 LAB — POCT RAPID STREP A (OFFICE): Rapid Strep A Screen: NEGATIVE

## 2017-03-27 LAB — POC INFLUENZA A&B (BINAX/QUICKVUE)
INFLUENZA B, POC: NEGATIVE
Influenza A, POC: NEGATIVE

## 2017-03-27 MED ORDER — GUAIFENESIN-CODEINE 100-10 MG/5ML PO SOLN: 5.0000 mL | Freq: Three times a day (TID) | ORAL | 0 refills | Status: DC | PRN

## 2017-03-27 MED ORDER — PREDNISONE 50 MG PO TABS: ORAL_TABLET | ORAL | 0 refills | Status: DC

## 2017-03-27 MED ORDER — AMOXICILLIN 875 MG PO TABS: 875.0000 mg | ORAL_TABLET | Freq: Two times a day (BID) | ORAL | 0 refills | Status: DC

## 2017-03-27 NOTE — Progress Notes (Signed)
    Subjective:  Jill Herrera is a 27 y.o. female who presents today for same-day appointment with a chief complaint of sore throat.   HPI:  Sore Throat, Acute Issue Started yesterday. Worsened over that time. Associated symptoms include fever, cough, myalgias and brownish sputum production. No known sick contacts. No treatments tried. No obvious alleviating or aggravating factors.   ROS: Per HPI  PMH: She reports that  has never smoked. she has never used smokeless tobacco. She reports that she drinks about 0.6 oz of alcohol per week. She reports that she does not use drugs.  Objective:  Physical Exam: BP 118/68 (BP Location: Right Arm, Patient Position: Sitting, Cuff Size: Normal)   Pulse (!) 116   Temp (!) 102.8 F (39.3 C) (Oral)   Ht 5\' 2"  (1.575 m)   Wt 153 lb 12.8 oz (69.8 kg)   SpO2 98%   BMI 28.13 kg/m   Gen: NAD, ill-appearing but nontoxic, resting comfortably HEENT: Right TM erythematous with effusion. Left TM clear.  Oropharynx erythematous without exudate.  Moist mucous membranes.  No LAD. CV: RRR with no murmurs appreciated Pulm: NWOB, CTAB with no crackles, wheezes, or rhonchi.  Able to speak in full sentences  Results for orders placed or performed in visit on 03/27/17 (from the past 24 hour(s))  POC Influenza A&B(BINAX/QUICKVUE)     Status: None   Collection Time: 03/27/17  2:31 PM  Result Value Ref Range   Influenza A, POC Negative Negative   Influenza B, POC Negative Negative  POCT rapid strep A     Status: None   Collection Time: 03/27/17  2:32 PM  Result Value Ref Range   Rapid Strep A Screen Negative Negative    Assessment/Plan:  Cough, AOM Rapid strep negative. Rapid flu negative. Exam findings consistent with right otitis media.  Her pulmonary exam is normal-no signs of pneumonia or bronchitis.  We will treat her ear infection with 1 week course of amoxicillin.  Also give 2-day course of prednisone for her sore throat.  Will give  guaifenesin-codeine cough syrup for cough.  Encouraged good oral hydration.  Recommended Tylenol and/or Motrin as needed for low-grade fever and Herrera.  Return precautions reviewed.  Follow-up as needed.  Jill Herrera. Jill Pain, MD 03/27/2017 2:32 PM

## 2017-03-27 NOTE — Patient Instructions (Signed)
Start the amoxicilin and prednisone. Use the cough syrup as needed.   Please stay well hydrated.  You can take tylenol and/or motrin as needed for low grade fever and pain.  Please let me know if your symptoms worsen or fail to improve.  Take care, Dr Jerline Pain

## 2017-05-24 ENCOUNTER — Telehealth: Payer: Self-pay | Admitting: Physician Assistant

## 2017-05-24 NOTE — Telephone Encounter (Signed)
Copied from [place]. Topic: Quick Communication - Rx Refill/Question >> May 24, 2017 12:13 PM Lennox Solders wrote: Medication:malaria pills. [facility] [address]. Pt will be travelling to [place] on this Sunday and will return April 29

## 2017-05-24 NOTE — Telephone Encounter (Signed)
See note

## 2017-05-25 NOTE — Telephone Encounter (Signed)
OK to do-

## 2017-05-25 NOTE — Telephone Encounter (Signed)
Needs office visit.  Inda Coke PA-C 05/25/17

## 2017-05-25 NOTE — Telephone Encounter (Signed)
Spoke with patient and informed her she would have to schedule an appt. Pt understood and said she'll figure something out. Original rx from [facility] dr.

## 2018-01-02 ENCOUNTER — Encounter: Payer: Self-pay | Admitting: *Deleted

## 2018-03-12 ENCOUNTER — Other Ambulatory Visit: Payer: Self-pay | Admitting: Obstetrics & Gynecology

## 2018-03-12 NOTE — Telephone Encounter (Signed)
annual exam scheduled on 03/26/18

## 2018-03-26 ENCOUNTER — Encounter: Payer: Self-pay | Admitting: Obstetrics & Gynecology

## 2018-03-26 ENCOUNTER — Ambulatory Visit (INDEPENDENT_AMBULATORY_CARE_PROVIDER_SITE_OTHER): Payer: BLUE CROSS/BLUE SHIELD | Admitting: Obstetrics & Gynecology

## 2018-03-26 VITALS — BP 130/82 | Ht 61.0 in | Wt 161.0 lb

## 2018-03-26 DIAGNOSIS — Z3041 Encounter for surveillance of contraceptive pills: Secondary | ICD-10-CM | POA: Diagnosis not present

## 2018-03-26 DIAGNOSIS — R87610 Atypical squamous cells of undetermined significance on cytologic smear of cervix (ASC-US): Secondary | ICD-10-CM | POA: Diagnosis not present

## 2018-03-26 DIAGNOSIS — Z30432 Encounter for removal of intrauterine contraceptive device: Secondary | ICD-10-CM

## 2018-03-26 DIAGNOSIS — Z01419 Encounter for gynecological examination (general) (routine) without abnormal findings: Secondary | ICD-10-CM | POA: Diagnosis not present

## 2018-03-26 DIAGNOSIS — Z1151 Encounter for screening for human papillomavirus (HPV): Secondary | ICD-10-CM

## 2018-03-26 MED ORDER — NORETHIN ACE-ETH ESTRAD-FE 1-20 MG-MCG(24) PO TABS: 1.0000 | ORAL_TABLET | Freq: Every day | ORAL | 4 refills | Status: DC

## 2018-03-26 NOTE — Progress Notes (Signed)
Jill Herrera [birth year] [id number]   History:    28 y.o. G0 Married  RP:  Established patient presenting for annual gyn exam   HPI: On Mirena IUD for about 2 years with uterine cramping most days.  Patient is also on low-dose birth control pills Blisovi 24 FE 1/20 in an attempt to control that uterine cramping, but not successful.  Patient desires removal of the IUD.  Will continue on birth control pills.  No breakthrough bleeding.  Normal vaginal secretions.  Breast normal.  Body mass index 30.42.  Exercises regularly.  Past medical history,surgical history, family history and social history were all reviewed and documented in the EPIC chart.  Gynecologic History No LMP recorded. (Menstrual status: IUD). Contraception: IUD and OCP (estrogen/progesterone) Last Pap: 01/2017. Results were: ASCUS/HPV HR neg Last mammogram: Never Bone Density: Never Colonoscopy: Never  Obstetric History OB History  Gravida Para Term Preterm AB Living  0 0 0 0 0 0  SAB TAB Ectopic Multiple Live Births  0 0 0 0 0     ROS: A ROS was performed and pertinent positives and negatives are included in the history.  GENERAL: No fevers or chills. HEENT: No change in vision, no earache, sore throat or sinus congestion. NECK: No pain or stiffness. CARDIOVASCULAR: No chest pain or pressure. No palpitations. PULMONARY: No shortness of breath, cough or wheeze. GASTROINTESTINAL: No abdominal pain, nausea, vomiting or diarrhea, melena or bright red blood per rectum. GENITOURINARY: No urinary frequency, urgency, hesitancy or dysuria. MUSCULOSKELETAL: No joint or muscle pain, no back pain, no recent trauma. DERMATOLOGIC: No rash, no itching, no lesions. ENDOCRINE: No polyuria, polydipsia, no heat or cold intolerance. No recent change in weight. HEMATOLOGICAL: No anemia or easy bruising or bleeding. NEUROLOGIC: No headache, seizures, numbness, tingling or weakness. PSYCHIATRIC: No depression, no loss of interest in  normal activity or change in sleep pattern.     Exam:   BP 130/82   Ht 5\' 1"  (1.549 m)   Wt 161 lb (73 kg)   BMI 30.42 kg/m   Body mass index is 30.42 kg/m.  General appearance : Well developed well nourished female. No acute distress HEENT: Eyes: no retinal hemorrhage or exudates,  Neck supple, trachea midline, no carotid bruits, no thyroidmegaly Lungs: Clear to auscultation, no rhonchi or wheezes, or rib retractions  Heart: Regular rate and rhythm, no murmurs or gallops Breast:Examined in sitting and supine position were symmetrical in appearance, no palpable masses or tenderness,  no skin retraction, no nipple inversion, no nipple discharge, no skin discoloration, no axillary or supraclavicular lymphadenopathy Abdomen: no palpable masses or tenderness, no rebound or guarding Extremities: no edema or skin discoloration or tenderness  Pelvic: Vulva: Normal             Vagina: No gross lesions or discharge  Cervix: No gross lesions or discharge.  Pap/HPV HR done.  IUD strings visible.  Easy removal of IUD by pulling on strings with a fenestrated clamp.  IUD intact, complete, shown to patient and discarded.  Uterus  AV, normal size, shape and consistency, non-tender and mobile  Adnexa  Without masses or tenderness  Anus: Normal   Assessment/Plan:  28 y.o. female for annual exam   1. Encounter for routine gynecological examination with Papanicolaou smear of cervix Normal gynecologic exam.  Pap test in December 2018 showed ASCUS with negative high-risk HPV.  Pap test with high risk HPV repeated today.  Breast exam normal.  Body mass index 30.48, with  good muscle mass.  Good fitness and healthy nutrition.  Recommend mildly decreasing the calories/carbs in nutrition. - PAP,TP IMGw/HPV RNA,rflx HPVTYPE16,18/45  2. ASCUS of cervix with negative high risk HPV  3. Special screening examination for human papillomavirus (HPV) - PAP,TP IMGw/HPV RNA,rflx HPVTYPE16,18/45  4. Encounter for  surveillance of contraceptive pills Will continue on Blisovi 24 FE 1/20.  No contraindication to continue on birth control pill.  Usage known.  Prescription sent to pharmacy.  5. Encounter for removal of intrauterine contraceptive device (IUD) Easy removal of Mirena IUD.  No complication.  Well-tolerated by patient.  IUD intact, complete, shown to patient and discarded.  Other orders - Norethindrone Acetate-Ethinyl Estrad-FE (BLISOVI 24 FE) 1-20 MG-MCG(24) tablet; Take 1 tablet by mouth daily.  Counseling on above issues and coordination of care more than 50% for 10 minutes.  Princess Bruins MD, 4:34 PM 03/26/2018

## 2018-03-26 NOTE — Patient Instructions (Signed)
1. Encounter for routine gynecological examination with Papanicolaou smear of cervix Normal gynecologic exam.  Pap test in December 2018 showed ASCUS with negative high-risk HPV.  Pap test with high risk HPV repeated today.  Breast exam normal.  Body mass index 30.48, with good muscle mass.  Good fitness and healthy nutrition.  Recommend mildly decreasing the calories/carbs in nutrition. - PAP,TP IMGw/HPV RNA,rflx HPVTYPE16,18/45  2. ASCUS of cervix with negative high risk HPV  3. Special screening examination for human papillomavirus (HPV) - PAP,TP IMGw/HPV RNA,rflx HPVTYPE16,18/45  4. Encounter for surveillance of contraceptive pills Will continue on Blisovi 24 FE 1/20.  No contraindication to continue on birth control pill.  Usage known.  Prescription sent to pharmacy.  5. Encounter for removal of intrauterine contraceptive device (IUD) Easy removal of Mirena IUD.  No complication.  Well-tolerated by patient.  IUD intact, complete, shown to patient and discarded.  Other orders - Norethindrone Acetate-Ethinyl Estrad-FE (BLISOVI 24 FE) 1-20 MG-MCG(24) tablet; Take 1 tablet by mouth daily.  Jill Herrera, it was a pleasure seeing you today!  I will inform you of your results as soon as they are available.

## 2018-03-28 LAB — PAP, TP IMAGING W/ HPV RNA, RFLX HPV TYPE 16,18/45: HPV DNA HIGH RISK: NOT DETECTED

## 2018-05-10 ENCOUNTER — Encounter: Payer: Self-pay | Admitting: Family Medicine

## 2018-05-10 ENCOUNTER — Other Ambulatory Visit: Payer: Self-pay

## 2018-05-10 ENCOUNTER — Ambulatory Visit (INDEPENDENT_AMBULATORY_CARE_PROVIDER_SITE_OTHER): Payer: BLUE CROSS/BLUE SHIELD | Admitting: Family Medicine

## 2018-05-10 VITALS — BP 116/82 | HR 95 | Temp 99.0°F | Ht 61.0 in | Wt 161.0 lb

## 2018-05-10 DIAGNOSIS — J069 Acute upper respiratory infection, unspecified: Secondary | ICD-10-CM | POA: Diagnosis not present

## 2018-05-10 LAB — POCT INFLUENZA A/B
INFLUENZA A, POC: NEGATIVE
Influenza B, POC: NEGATIVE

## 2018-05-10 MED ORDER — GUAIFENESIN-CODEINE 100-10 MG/5ML PO SOLN: 10.0000 mL | Freq: Four times a day (QID) | ORAL | 0 refills | Status: DC | PRN

## 2018-05-10 MED ORDER — ALBUTEROL SULFATE HFA 108 (90 BASE) MCG/ACT IN AERS: 1.0000 | INHALATION_SPRAY | RESPIRATORY_TRACT | 1 refills | Status: AC | PRN

## 2018-05-10 MED ORDER — BENZONATATE 200 MG PO CAPS: 200.0000 mg | ORAL_CAPSULE | Freq: Two times a day (BID) | ORAL | 0 refills | Status: DC | PRN

## 2018-05-10 NOTE — Progress Notes (Signed)
Questions for Screening COVID-19  Symptom onset: 3/19 around 5:30 pm  Travel or Contacts: no travel/no known contacts  During this illness, did/does the patient experience any of the following symptoms? Fever >100.63F []   Yes [x]   No []   Unknown Subjective fever (felt feverish) []   Yes []   No [x]   Unknown Chills []   Yes [x]   No []   Unknown Muscle aches (myalgia) []   Yes [x]   No []   Unknown Runny nose (rhinorrhea) []   Yes [x]   No []   Unknown Sore throat []   Yes [x]   No []   Unknown Cough (new onset or worsening of chronic cough) [x]   Yes []   No []   Unknown Shortness of breath (dyspnea) [x]   Yes []   No []   Unknown Nausea or vomiting []   Yes [x]   No []   Unknown Headache [x]   Yes []   No []   Unknown Abdominal pain  []   Yes [x]   No []   Unknown Diarrhea (?3 loose/looser than normal stools/24hr period) []   Yes [x]   No []   Unknown Other, specify: chest pain, fatigue   Patient risk factors: Smoker? []   Current []   Former [x]   Never If female, currently pregnant? []   Yes [x]   No  Patient Active Problem List   Diagnosis Date Noted  . PCOS (polycystic ovarian syndrome) 02/05/2015    Plan:  []   High risk for COVID-19 with red flags go to ED (with CP, SOB, weak/lightheaded, or fever > 101.5). Call ahead.  []   High risk for COVID-19 but stable will have car visit. Inform provider and coordinate time. Will be completed in afternoon. [x]   No red flags but URI signs or symptoms will go through side door and be seen in dedicated room.  Note: Referral to telemedicine is an appropriate alternative disposition for higher risk but stable. [facility]/e-Visit: [phone].    Subjective  CC: No chief complaint on file.   HPI: Jill Herrera is a 28 y.o. female who presents to the office today to address the problems listed above in the chief complaint.    Assessment  No diagnosis found.   Plan   F/u in office.   Follow up: No follow-ups on file.  Visit date not found  No  orders of the defined types were placed in this encounter.  No orders of the defined types were placed in this encounter.     I reviewed the patients updated PMH, FH, and SocHx.    Patient Active Problem List   Diagnosis Date Noted  . PCOS (polycystic ovarian syndrome) 02/05/2015   No outpatient medications have been marked as taking for the 05/10/18 encounter (Appointment) with Orma Flaming, MD.    Allergies: Patient has No Known Allergies. Family History: Patient family history includes Asthma in her mother; Diabetes in her mother; Miscarriages / [place] in her mother. Social History:  Patient  reports that she has never smoked. She has never used smokeless tobacco. She reports current alcohol use of about 1.0 standard drinks of alcohol per week. She reports that she does not use drugs.  Review of Systems: Constitutional: Negative for fever malaise or anorexia Cardiovascular: negative for chest pain Respiratory: negative for SOB or persistent cough Gastrointestinal: negative for abdominal pain  Objective  Vitals: There were no vitals taken for this visit. General: no acute distress , A&Ox3 HEENT: PEERL, conjunctiva normal, Oropharynx moist,neck is supple Cardiovascular:  RRR without murmur or gallop.  Respiratory:  Good breath sounds bilaterally, CTAB with normal respiratory  effort Skin:  Warm, no rashes     Commons side effects, risks, benefits, and alternatives for medications and treatment plan prescribed today were discussed, and the patient expressed understanding of the given instructions. Patient is instructed to call or message via MyChart if he/she has any questions or concerns regarding our treatment plan. No barriers to understanding were identified. We discussed Red Flag symptoms and signs in detail. Patient expressed understanding regarding what to do in case of urgent or emergency type symptoms.   Medication list was reconciled, printed and provided to  the patient in AVS. Patient instructions and summary information was reviewed with the patient as documented in the AVS. This note was prepared with assistance of Dragon voice recognition software. Occasional wrong-word or sound-a-like substitutions may have occurred due to the inherent limitations of voice recognition software

## 2018-05-10 NOTE — Patient Instructions (Signed)
Sending in tessalon pearls for cough. You can take TID prn Codeine cough syrup prn for cough Cool mist humidifier at night Honey daily I like robitussin DM during the day too  Worsening symptoms need to get swabbed.   Upper Respiratory Infection, Adult An upper respiratory infection (URI) affects the nose, throat, and upper air passages. URIs are caused by germs (viruses). The most common type of URI is often called "the common cold." Medicines cannot cure URIs, but you can do things at home to relieve your symptoms. URIs usually get better within 7-10 days. Follow these instructions at home: Activity  Rest as needed.  If you have a fever, stay home from work or school until your fever is gone, or until your doctor says you may return to work or school. ? You should stay home until you cannot spread the infection anymore (you are not contagious). ? Your doctor may have you wear a face mask so you have less risk of spreading the infection. Relieving symptoms  Gargle with a salt-water mixture 3-4 times a day or as needed. To make a salt-water mixture, completely dissolve -1 tsp of salt in 1 cup of warm water.  Use a cool-mist humidifier to add moisture to the air. This can help you breathe more easily. Eating and drinking   Drink enough fluid to keep your pee (urine) pale yellow.  Eat soups and other clear broths. General instructions   Take over-the-counter and prescription medicines only as told by your doctor. These include cold medicines, fever reducers, and cough suppressants.  Do not use any products that contain nicotine or tobacco. These include cigarettes and e-cigarettes. If you need help quitting, ask your doctor.  Avoid being where people are smoking (avoid secondhand smoke).  Make sure you get regular shots and get the flu shot every year.  Keep all follow-up visits as told by your doctor. This is important. How to avoid spreading infection to others   Wash  your hands often with soap and water. If you do not have soap and water, use hand sanitizer.  Avoid touching your mouth, face, eyes, or nose.  Cough or sneeze into a tissue or your sleeve or elbow. Do not cough or sneeze into your hand or into the air. Contact a doctor if:  You are getting worse, not better.  You have any of these: ? A fever. ? Chills. ? Brown or red mucus in your nose. ? Yellow or brown fluid (discharge)coming from your nose. ? Pain in your face, especially when you bend forward. ? Swollen neck glands. ? Pain with swallowing. ? White areas in the back of your throat. Get help right away if:  You have shortness of breath that gets worse.  You have very bad or constant: ? Headache. ? Ear pain. ? Pain in your forehead, behind your eyes, and over your cheekbones (sinus pain). ? Chest pain.  You have long-lasting (chronic) lung disease along with any of these: ? Wheezing. ? Long-lasting cough. ? Coughing up blood. ? A change in your usual mucus.  You have a stiff neck.  You have changes in your: ? Vision. ? Hearing. ? Thinking. ? Mood. Summary  An upper respiratory infection (URI) is caused by a germ called a virus. The most common type of URI is often called "the common cold."  URIs usually get better within 7-10 days.  Take over-the-counter and prescription medicines only as told by your doctor. This information is not intended  to replace advice given to you by your health care provider. Make sure you discuss any questions you have with your health care provider. Document Released: 07/26/2007 Document Revised: 09/29/2016 Document Reviewed: 09/29/2016 Elsevier Interactive Patient Education  2019 [employer].

## 2018-05-10 NOTE — Progress Notes (Signed)
Patient: Jill Herrera MRN: [id number] DOB: 1991/01/08 PCP: Inda Coke, PA     Subjective:  Chief Complaint  Patient presents with  . Cough  . low grade temp    HPI: The patient is a 28 y.o. female who presents today for cough, chest tightness, low grade temp, and muscle aches. Yesterday she left work around 5:30pm and she started to feel chest tightness and she panicked. She had some associated shortness of breath and after 3 minutes it went away. She had similar feeling while cooking dinner and then before bed. Temperature last night was 99.3. She states this AM she had a stuffy nose, mild cough, headache and body aches. She just overall doesn't feel like herself. When she coughs she feels some burning in her chest/tightness, but not as bad as the yesterday. 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 She does have one in the hospital for pneumonia.   Review of Systems  Constitutional: Positive for chills, fatigue and fever.  HENT: Positive for congestion, postnasal drip and rhinorrhea. Negative for ear pain, sinus pressure, sinus pain, sore throat and trouble swallowing.   Eyes: Positive for photophobia and pain.  Respiratory: Positive for cough and shortness of breath.   Cardiovascular: Positive for chest pain.       Pt states c/o chest pain only with cough  Gastrointestinal: Negative for abdominal pain and nausea.  Musculoskeletal: Positive for myalgias. Negative for back pain and neck pain.  Neurological: Positive for headaches. Negative for dizziness.  Psychiatric/Behavioral: Negative for sleep disturbance.    Allergies Patient has No Known Allergies.  Past Medical History Patient  has a past medical history of Anemia and Fibroid of cervix.  Surgical History Patient  has a past surgical history that includes Uterine fibroid surgery and Robot assisted mtomectomy (N/A, 06/2011).  Family  History Pateint's family history includes Asthma in her mother; Diabetes in her mother; Miscarriages / [place] in her mother.  Social History Patient  reports that she has never smoked. She has never used smokeless tobacco. She reports current alcohol use of about 1.0 standard drinks of alcohol per week. She reports that she does not use drugs.    Objective: Vitals:   05/10/18 1254  BP: 116/82  Pulse: 95  Temp: 99 F (37.2 C)  TempSrc: Oral  SpO2: 98%  Weight: 161 lb (73 kg)  Height: 5\' 1"  (1.549 m)    Body mass index is 30.42 kg/m.  Physical Exam Vitals signs reviewed.  Constitutional:      General: She is not in acute distress.    Appearance: Normal appearance. She is not toxic-appearing.  HENT:     Head: Normocephalic and atraumatic.     Right Ear: Tympanic membrane, ear canal and external ear normal.     Left Ear: Tympanic membrane, ear canal and external ear normal.     Nose: Nose normal. No congestion.     Comments: No ttp over sinuses  Neck:     Musculoskeletal: Normal range of motion and neck supple.  Cardiovascular:     Rate and Rhythm: Normal rate and regular rhythm.     Heart sounds: Normal heart sounds.  Pulmonary:     Effort: Pulmonary effort is normal. No respiratory distress.     Breath sounds: Normal breath sounds. No wheezing or rales.  Abdominal:     General: Abdomen is flat. Bowel sounds are normal.  Palpations: Abdomen is soft.  Lymphadenopathy:     Cervical: No cervical adenopathy.  Skin:    General: Skin is warm.     Capillary Refill: Capillary refill takes less than 2 seconds.  Neurological:     General: No focal deficit present.     Mental Status: She is alert and oriented to person, place, and time.      Influenza: negative.   Assessment/plan: 1. URI, acute Appears to be viral illness. Flu negative and exam normal. Do not think this is due to COVID-19. Recommended she stay at home and take otc cough medication, flonase, cool  mist humidifier and honey. Will also refill her inhaler and send in some codeine cough syrup to take prn. Strict precautions given for worsening symptoms.  - POCT Influenza A/B    Return if symptoms worsen or fail to improve.   Orma Flaming, MD [place]   05/10/2018

## 2018-05-19 ENCOUNTER — Emergency Department (HOSPITAL_COMMUNITY): Payer: BLUE CROSS/BLUE SHIELD

## 2018-05-19 ENCOUNTER — Emergency Department (HOSPITAL_COMMUNITY)
Admission: EM | Admit: 2018-05-19 | Discharge: 2018-05-19 | Disposition: A | Discharge status: Home / Self Care | Payer: BLUE CROSS/BLUE SHIELD | Attending: Emergency Medicine | Admitting: Emergency Medicine

## 2018-05-19 ENCOUNTER — Other Ambulatory Visit: Payer: Self-pay

## 2018-05-19 ENCOUNTER — Encounter (HOSPITAL_COMMUNITY): Payer: Self-pay | Admitting: Emergency Medicine

## 2018-05-19 DIAGNOSIS — R69 Illness, unspecified: Secondary | ICD-10-CM

## 2018-05-19 DIAGNOSIS — R0602 Shortness of breath: Secondary | ICD-10-CM

## 2018-05-19 DIAGNOSIS — J029 Acute pharyngitis, unspecified: Secondary | ICD-10-CM | POA: Diagnosis not present

## 2018-05-19 DIAGNOSIS — R05 Cough: Secondary | ICD-10-CM

## 2018-05-19 DIAGNOSIS — R197 Diarrhea, unspecified: Secondary | ICD-10-CM

## 2018-05-19 DIAGNOSIS — R110 Nausea: Secondary | ICD-10-CM

## 2018-05-19 DIAGNOSIS — R059 Cough, unspecified: Secondary | ICD-10-CM

## 2018-05-19 DIAGNOSIS — J111 Influenza due to unidentified influenza virus with other respiratory manifestations: Secondary | ICD-10-CM

## 2018-05-19 DIAGNOSIS — R509 Fever, unspecified: Secondary | ICD-10-CM

## 2018-05-19 DIAGNOSIS — R079 Chest pain, unspecified: Secondary | ICD-10-CM | POA: Diagnosis not present

## 2018-05-19 DIAGNOSIS — Z79899 Other long term (current) drug therapy: Secondary | ICD-10-CM | POA: Insufficient documentation

## 2018-05-19 DIAGNOSIS — A09 Infectious gastroenteritis and colitis, unspecified: Secondary | ICD-10-CM | POA: Diagnosis not present

## 2018-05-19 LAB — COMPREHENSIVE METABOLIC PANEL
ALT: 52 U/L — ABNORMAL HIGH (ref 0–44)
AST: 52 U/L — ABNORMAL HIGH (ref 15–41)
Albumin: 3.3 g/dL — ABNORMAL LOW (ref 3.5–5.0)
Alkaline Phosphatase: 53 U/L (ref 38–126)
Anion gap: 8 (ref 5–15)
BUN: <5 mg/dL — ABNORMAL LOW (ref 6–20)
CO2: 22 mmol/L (ref 22–32)
Calcium: 8.7 mg/dL — ABNORMAL LOW (ref 8.9–10.3)
Chloride: 104 mmol/L (ref 98–111)
Creatinine, Ser: 0.66 mg/dL (ref 0.44–1.00)
GFR calc Af Amer: >60 mL/min (ref >60)
GFR calc non Af Amer: >60 mL/min (ref >60)
Glucose, Bld: 114 mg/dL — ABNORMAL HIGH (ref 70–99)
Potassium: 3.6 mmol/L (ref 3.5–5.1)
Sodium: 134 mmol/L — ABNORMAL LOW (ref 135–145)
Total Bilirubin: 1 mg/dL (ref 0.3–1.2)
Total Protein: 7.2 g/dL (ref 6.5–8.1)

## 2018-05-19 LAB — URINALYSIS, ROUTINE W REFLEX MICROSCOPIC
Bilirubin Urine: NEGATIVE
Glucose, UA: NEGATIVE mg/dL
Hgb urine dipstick: NEGATIVE
Ketones, ur: 5 mg/dL — AB
Nitrite: NEGATIVE
Protein, ur: NEGATIVE mg/dL
Specific Gravity, Urine: 1.02 (ref 1.005–1.030)
pH: 6 (ref 5.0–8.0)

## 2018-05-19 LAB — CBC WITH DIFFERENTIAL/PLATELET
Abs Immature Granulocytes: 0.06 10*3/uL (ref 0.00–0.07)
Basophils Absolute: 0 10*3/uL (ref 0.0–0.1)
Basophils Relative: 0 %
Eosinophils Absolute: 0 10*3/uL (ref 0.0–0.5)
Eosinophils Relative: 0 %
HCT: 34.8 % — ABNORMAL LOW (ref 36.0–46.0)
Hemoglobin: 11.4 g/dL — ABNORMAL LOW (ref 12.0–15.0)
Immature Granulocytes: 2 %
Lymphocytes Relative: 21 %
Lymphs Abs: 0.7 10*3/uL (ref 0.7–4.0)
MCH: 22.8 pg — ABNORMAL LOW (ref 26.0–34.0)
MCHC: 32.8 g/dL (ref 30.0–36.0)
MCV: 69.5 fL — ABNORMAL LOW (ref 80.0–100.0)
Monocytes Absolute: 0.2 10*3/uL (ref 0.1–1.0)
Monocytes Relative: 7 %
Neutro Abs: 2.4 10*3/uL (ref 1.7–7.7)
Neutrophils Relative %: 70 %
Platelets: 266 10*3/uL (ref 150–400)
RBC: 5.01 MIL/uL (ref 3.87–5.11)
RDW: 14.4 % (ref 11.5–15.5)
WBC: 3.4 10*3/uL — ABNORMAL LOW (ref 4.0–10.5)
nRBC: 0 % (ref 0.0–0.2)

## 2018-05-19 LAB — LIPASE, BLOOD: Lipase: 34 U/L (ref 11–51)

## 2018-05-19 LAB — I-STAT BETA HCG BLOOD, ED (MC, WL, AP ONLY): I-stat hCG, quantitative: <5 m[IU]/mL (ref <5)

## 2018-05-19 MED ORDER — ACETAMINOPHEN 500 MG PO TABS
500.0000 mg | ORAL_TABLET | Freq: Once | ORAL | Status: AC
  Administered 2018-05-19: 500 mg via ORAL
  Filled 2018-05-19: qty 1

## 2018-05-19 MED ORDER — ALBUTEROL SULFATE HFA 108 (90 BASE) MCG/ACT IN AERS
8.0000 | INHALATION_SPRAY | Freq: Once | RESPIRATORY_TRACT | Status: AC
  Administered 2018-05-19: 8 via RESPIRATORY_TRACT
  Filled 2018-05-19: qty 6.7

## 2018-05-19 MED ORDER — ONDANSETRON 4 MG PO TBDP: 4.0000 mg | ORAL_TABLET | Freq: Three times a day (TID) | ORAL | 0 refills | Status: DC | PRN

## 2018-05-19 MED ORDER — SODIUM CHLORIDE 0.9 % IV BOLUS
500.0000 mL | Freq: Once | INTRAVENOUS | Status: AC
  Administered 2018-05-19: 500 mL via INTRAVENOUS

## 2018-05-19 NOTE — ED Notes (Signed)
Patient attempted to ambulate in room, O2 sats remained 98% but patient c/o dizziness and was only able to ambulate 4 feet before having to sit down.

## 2018-05-19 NOTE — ED Notes (Signed)
Patient verbalizes understanding of medications and discharge instructions. No further questions at this time. VSS and patient ambulatory at discharge.   

## 2018-05-19 NOTE — Discharge Instructions (Addendum)
Continue taking 1 to 2 tablets of Tylenol every 6 hours as needed for fever and pain.  Drink plenty of fluids and get plenty of rest.  You can also use Tessalon Perles for cough as needed.  Continue using the albuterol inhaler 1 to 2 puffs every 4-6 hours as needed for shortness of breath.  Take Zofran as needed for nausea and vomiting.  Let this medicine dissolve under your tongue and wait around 10 to 15 minutes before eating or drinking to give this medicine time to work.   We do recommend that you isolate yourself until the following conditions are met:  at least 7 days since symptoms onset  AND  > 72hrs after symptom resolution (absence of fever without the use of fever-reducing medication and improvement in respiratory symptoms)  Return to the emergency department if any concerning signs or symptoms develop such as worsening shortness of breath, chest pains, passing out, persistent vomiting, high fevers not responding to Tylenol, or color changes of the skin.      Person Under Monitoring Name: Jill Herrera  Location: [place] [id number]   Infection Prevention Recommendations for Individuals Confirmed to have, or Being Evaluated for, 2019 Novel Coronavirus (COVID-19) Infection Who Receive Care at Home  Individuals who are confirmed to have, or are being evaluated for, COVID-19 should follow the prevention steps below until a healthcare provider or local or state health department says they can return to normal activities.  Stay home except to get medical care You should restrict activities outside your home, except for getting medical care. Do not go to work, school, or public areas, and do not use public transportation or taxis.  Call ahead before visiting your doctor Before your medical appointment, call the healthcare provider and tell them that you have, or are being evaluated for, COVID-19 infection. This will help the healthcare providers  office take steps to keep other people from getting infected. Ask your healthcare provider to call the local or state health department.  Monitor your symptoms Seek prompt medical attention if your illness is worsening (e.g., difficulty breathing). Before going to your medical appointment, call the healthcare provider and tell them that you have, or are being evaluated for, COVID-19 infection. Ask your healthcare provider to call the local or state health department.  Wear a facemask You should wear a facemask that covers your nose and mouth when you are in the same room with other people and when you visit a healthcare provider. People who live with or visit you should also wear a facemask while they are in the same room with you.  Separate yourself from other people in your home As much as possible, you should stay in a different room from other people in your home. Also, you should use a separate bathroom, if available.  Avoid sharing household items You should not share dishes, drinking glasses, cups, eating utensils, towels, bedding, or other items with other people in your home. After using these items, you should wash them thoroughly with soap and water.  Cover your coughs and sneezes Cover your mouth and nose with a tissue when you cough or sneeze, or you can cough or sneeze into your sleeve. Throw used tissues in a lined trash can, and immediately wash your hands with soap and water for at least 20 seconds or use an alcohol-based hand rub.  Wash your [facility] your hands often and thoroughly with soap and water for at least  20 seconds. You can use an alcohol-based hand sanitizer if soap and water are not available and if your hands are not visibly dirty. Avoid touching your eyes, nose, and mouth with unwashed hands.   Prevention Steps for Caregivers and Household Members of Individuals Confirmed to have, or Being Evaluated for, COVID-19 Infection Being Cared for in the  Home  If you live with, or provide care at home for, a person confirmed to have, or being evaluated for, COVID-19 infection please follow these guidelines to prevent infection:  Follow healthcare providers instructions Make sure that you understand and can help the patient follow any healthcare provider instructions for all care.  Provide for the patients basic needs You should help the patient with basic needs in the home and provide support for getting groceries, prescriptions, and other personal needs.  Monitor the patients symptoms If they are getting sicker, call his or her medical provider and tell them that the patient has, or is being evaluated for, COVID-19 infection. This will help the healthcare providers office take steps to keep other people from getting infected. Ask the healthcare provider to call the local or state health department.  Limit the number of people who have contact with the patient If possible, have only one caregiver for the patient. Other household members should stay in another home or place of residence. If this is not possible, they should stay in another room, or be separated from the patient as much as possible. Use a separate bathroom, if available. Restrict visitors who do not have an essential need to be in the home.  Keep older adults, very young children, and other sick people away from the patient Keep older adults, very young children, and those who have compromised immune systems or chronic health conditions away from the patient. This includes people with chronic heart, lung, or kidney conditions, diabetes, and cancer.  Ensure good ventilation Make sure that shared spaces in the home have good air flow, such as from an air conditioner or an opened window, weather permitting.  Wash your hands often Wash your hands often and thoroughly with soap and water for at least 20 seconds. You can use an alcohol based hand sanitizer if soap and  water are not available and if your hands are not visibly dirty. Avoid touching your eyes, nose, and mouth with unwashed hands. Use disposable paper towels to dry your hands. If not available, use dedicated cloth towels and replace them when they become wet.  Wear a facemask and gloves Wear a disposable facemask at all times in the room and gloves when you touch or have contact with the patients blood, body fluids, and/or secretions or excretions, such as sweat, saliva, sputum, nasal mucus, vomit, urine, or feces.  Ensure the mask fits over your nose and mouth tightly, and do not touch it during use. Throw out disposable facemasks and gloves after using them. Do not reuse. Wash your hands immediately after removing your facemask and gloves. If your personal clothing becomes contaminated, carefully remove clothing and launder. Wash your hands after handling contaminated clothing. Place all used disposable facemasks, gloves, and other waste in a lined container before disposing them with other household waste. Remove gloves and wash your hands immediately after handling these items.  Do not share dishes, glasses, or other household items with the patient Avoid sharing household items. You should not share dishes, drinking glasses, cups, eating utensils, towels, bedding, or other items with a patient who is confirmed to  have, or being evaluated for, COVID-19 infection. After the person uses these items, you should wash them thoroughly with soap and water.  Wash laundry thoroughly Immediately remove and wash clothes or bedding that have blood, body fluids, and/or secretions or excretions, such as sweat, saliva, sputum, nasal mucus, vomit, urine, or feces, on them. Wear gloves when handling laundry from the patient. Read and follow directions on labels of laundry or clothing items and detergent. In general, wash and dry with the warmest temperatures recommended on the label.  Clean all areas the  individual has used often Clean all touchable surfaces, such as counters, tabletops, doorknobs, bathroom fixtures, toilets, phones, keyboards, tablets, and bedside tables, every day. Also, clean any surfaces that may have blood, body fluids, and/or secretions or excretions on them. Wear gloves when cleaning surfaces the patient has come in contact with. Use a diluted bleach solution (e.g., dilute bleach with 1 part bleach and 10 parts water) or a household disinfectant with a label that says EPA-registered for coronaviruses. To make a bleach solution at home, add 1 tablespoon of bleach to 1 quart (4 cups) of water. For a larger supply, add  cup of bleach to 1 gallon (16 cups) of water. Read labels of cleaning products and follow recommendations provided on product labels. Labels contain instructions for safe and effective use of the cleaning product including precautions you should take when applying the product, such as wearing gloves or eye protection and making sure you have good ventilation during use of the product. Remove gloves and wash hands immediately after cleaning.  Monitor yourself for signs and symptoms of illness Caregivers and household members are considered close contacts, should monitor their health, and will be asked to limit movement outside of the home to the extent possible. Follow the monitoring steps for close contacts listed on the symptom monitoring form.   ? If you have additional questions, contact your local health department or call the epidemiologist on call at [phone] (available 24/7). ? This guidance is subject to change. For the most up-to-date guidance from [facility], please refer to their website: [email or web address]

## 2018-05-19 NOTE — ED Provider Notes (Signed)
[facility] EMERGENCY DEPARTMENT Provider Note   CSN: [id number] Arrival date & time: 05/19/18  1845    History   Chief Complaint No chief complaint on file.   HPI Jill Herrera is a 28 y.o. female with history of PCOS and anemia presents for evaluation of acute onset, progressively worsening flulike symptoms for 10 days.  She reports symptoms began with sore throat nasal congestion and cough with chest tightness last Thursday.  She was seen and evaluated by her PCP the following day and tested negative for influenza.  She was discharged with Tessalon and albuterol which she has been using with some improvement.  She has not been taking Mucinex or cough medicine prescribed to her as she has been feeling lightheaded and did not want to make that worse.  Notes frontal headache which is not the worst headache she has ever experienced.  Nausea but no vomiting.  Cough is mostly nonproductive.  Endorses chest soreness with cough and persistent tightness with coughing.  No vomiting.  She reports that her symptoms had improved significantly but 3 days ago began to worsen again.  She endorses diarrhea, approximately 5 episodes of nonbloody watery stools daily for the past 2 days and a normal bowel movement today.  Notes that urine is dark in.  She had some upper abdominal discomfort 3 days ago with her diarrhea but none since then.  She is a non-smoker, denies recreational drug use or excessive alcohol intake.  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     The history is provided by the patient.    Past Medical History:  Diagnosis Date   Anemia    Fibroid of cervix     Patient Active Problem List   Diagnosis Date Noted   PCOS (polycystic ovarian syndrome) 02/05/2015    Past Surgical History:  Procedure Laterality Date    ROBOT ASSISTED MYOMECTOMY N/A 06/2011   UTERINE FIBROID SURGERY       OB History    Gravida  0   Para  0   Term  0   Preterm  0   AB  0   Living  0     SAB  0   TAB  0   Ectopic  0   Multiple  0   Live Births  0            Home Medications    Prior to Admission medications   Medication Sig Start Date End Date Taking? Authorizing Provider  albuterol (PROAIR HFA) 108 (90 Base) MCG/ACT inhaler Inhale 1-2 puffs into the lungs every 4 (four) hours as needed for wheezing or shortness of breath. 05/10/18  Yes Orma Flaming, MD  benzonatate (TESSALON) 200 MG capsule Take 1 capsule (200 mg total) by mouth 2 (two) times daily as needed for cough. 05/10/18  Yes Orma Flaming, MD  Ferrous Sulfate (IRON PO) Take 1 tablet by mouth daily.   Yes [provider]  guaiFENesin-codeine 100-10 MG/5ML syrup Take 10 mLs by mouth every 6 (six) hours as needed for cough. 05/10/18  Yes Orma Flaming, MD  Norethindrone Acetate-Ethinyl Estrad-FE (BLISOVI 24 FE) 1-20 MG-MCG(24) tablet Take 1 tablet by mouth daily. 03/26/18  Yes Princess Bruins, MD  Pseudoeph-Doxylamine-DM-APAP (NYQUIL PO) Take 15 mLs by mouth 2 (two) times daily as needed (cough).   Yes [provider]  ondansetron (ZOFRAN ODT) 4 MG disintegrating tablet Take 1 tablet (4 mg total) by mouth every 8 (eight) hours as needed for nausea or vomiting. 05/19/18   Renita Papa, PA-C    Family History Family History  Problem Relation Age of Onset   Asthma Mother    Diabetes Mother    Miscarriages / [place] Mother     Social History Social History   Tobacco Use   Smoking status: Never Smoker   Smokeless tobacco: Never Used  Substance Use Topics   Alcohol use: Yes    Alcohol/week: 1.0 standard drinks    Types: 1 Glasses of wine per week    Comment: [place]   Drug use: No     Allergies   Patient has no known allergies.   Review of Systems Review of Systems  Constitutional:  Positive for chills and fever.  HENT: Positive for congestion and sore throat.   Eyes: Positive for photophobia. Negative for visual disturbance.  Respiratory: Positive for cough and shortness of breath.   Cardiovascular: Positive for chest pain. Negative for leg swelling.  Gastrointestinal: Positive for abdominal pain, diarrhea and nausea. Negative for blood in stool and vomiting.  Musculoskeletal: Positive for myalgias.  Neurological: Positive for light-headedness and headaches. Negative for syncope.  All other systems reviewed and are negative.    Physical Exam Updated Vital Signs BP 114/77    Pulse 94    Temp 98.6 F (37 C) (Oral)    Resp (!) 22    Ht 5\' 1"  (1.549 m)    Wt 73 kg    LMP  (LMP Unknown)    SpO2 100%    BMI 30.42 kg/m   Physical Exam Vitals signs and nursing note reviewed.  Constitutional:      General: She is not in acute distress.    Appearance: She is well-developed. She is ill-appearing.  HENT:     Head: Normocephalic and atraumatic.  Eyes:     General:        Right eye: No discharge.        Left eye: No discharge.     Extraocular Movements: Extraocular movements intact.     Conjunctiva/sclera: Conjunctivae normal.     Pupils: Pupils are equal, round, and reactive to light.  Neck:     Musculoskeletal: Normal range of motion and neck supple. No neck rigidity or muscular tenderness.     Vascular: No JVD.     Trachea: No tracheal deviation.  Cardiovascular:     Rate and Rhythm: Regular rhythm. Tachycardia present.     Pulses: Normal pulses.     Heart sounds: Normal heart sounds.  Pulmonary:     Effort: Pulmonary effort is normal.     Breath sounds: No wheezing.     Comments: Globally diminished breath sounds.  Mildly tachypneic.  Speaking in full sentences without difficulty, SPO2 saturations 100% on room air Abdominal:     General: Abdomen is flat. Bowel sounds are normal. There is no distension.     Palpations: Abdomen is soft.     Tenderness: There  is no abdominal tenderness. There is no guarding or rebound.  Skin:    General: Skin is warm and dry.     Findings: No erythema.  Neurological:     General: No focal deficit present.     Mental Status: She is alert.     GCS: GCS eye subscore is 4. GCS verbal subscore is 5. GCS  motor subscore is 6.     Cranial Nerves: No cranial nerve deficit.     Coordination: Coordination normal.     Comments: Fluent speech with no evidence of dysarthria or aphasia, no facial droop.  Cranial nerves appear grossly intact.  Moves extremity spontaneously with intact strength.  Psychiatric:        Behavior: Behavior normal.      ED Treatments / Results  Labs (all labs ordered are listed, but only abnormal results are displayed) Labs Reviewed  CBC WITH DIFFERENTIAL/PLATELET - Abnormal; Notable for the following components:      Result Value   WBC 3.4 (*)    Hemoglobin 11.4 (*)    HCT 34.8 (*)    MCV 69.5 (*)    MCH 22.8 (*)    All other components within normal limits  COMPREHENSIVE METABOLIC PANEL - Abnormal; Notable for the following components:   Sodium 134 (*)    Glucose, Bld 114 (*)    BUN <5 (*)    Calcium 8.7 (*)    Albumin 3.3 (*)    AST 52 (*)    ALT 52 (*)    All other components within normal limits  URINALYSIS, ROUTINE W REFLEX MICROSCOPIC - Abnormal; Notable for the following components:   Color, Urine AMBER (*)    APPearance CLOUDY (*)    Ketones, ur 5 (*)    Leukocytes,Ua SMALL (*)    Bacteria, UA RARE (*)    All other components within normal limits  LIPASE, BLOOD  I-STAT BETA HCG BLOOD, ED ([facility], [facility], [facility] ONLY)    EKG None  Radiology Dg Chest Portable 1 View  Result Date: 05/19/2018 CLINICAL DATA:  Chest pain. EXAM: PORTABLE CHEST 1 VIEW COMPARISON:  September 13, 2009 FINDINGS: The cardiac silhouette is mildly enlarged. However, cardiac silhouette size is not well evaluated on a portable film. The hila, mediastinum, lungs, and pleura are otherwise normal. IMPRESSION: Mild  prominence the cardiac silhouette may be due to the low volume portable technique. No other abnormalities. Electronically Signed   By: Dorise Bullion III M.D   On: 05/19/2018 20:03    Procedures Procedures (including critical care time)  Medications Ordered in ED Medications  acetaminophen (TYLENOL) tablet 500 mg (500 mg Oral Given 05/19/18 2027)  albuterol (PROVENTIL HFA;VENTOLIN HFA) 108 (90 Base) MCG/ACT inhaler 8 puff (8 puffs Inhalation Given 05/19/18 2027)  sodium chloride 0.9 % bolus 500 mL (500 mLs Intravenous New Bag/Given 05/19/18 2028)     Initial Impression / Assessment and Plan / ED Course  I have reviewed the triage vital signs and the nursing notes.  Pertinent labs & imaging results that were available during my care of the patient were reviewed by me and considered in my medical decision making (see chart for details).     Patient presenting with waxing and waning flulike symptoms for 9 days.  Initially febrile at 102.7 F and tachycardic, somewhat tachypneic on initial presentation with improvement on subsequent reevaluation's.  SPO2 saturations stable at rest.  She appears ill but not septic.  Lab work significant for leukopenia, mildly elevated LFTs.  UA does not suggest UTI or nephrolithiasis but does suggest some dehydration.  Chest x-ray shows no acute cardiopulmonary abnormalities, no evidence of pneumonia or pleural effusion.  Ambulated in the ED with stable SPO2 saturations.  She has tested negative for influenza with her PCP.  Concern for possible COVID-19, but no recent travel and no known exposures. Multiple sick contacts at work.  Doubt PE as symptoms appear more infectious in nature.  She was given some IV fluids and Tylenol and a few puffs of albuterol in the ED with improvement.  On reassessment she reports she is feeling somewhat better.  She is tolerating p.o. fluids without difficulty.  At this time stable for discharge home.  Encouraged continued self isolation  until she has been symptom-free for 3 days (afebrile without the use of antipyretics).  Discussed strict ED return precautions. Pt verbalized understanding of and agreement with plan and is safe for discharge home at this time. Discussed with Dr. Tomi Bamberger who agrees with assessment and plan at this time.   Amybeth Riano was evaluated in Emergency Department on 05/19/2018 for the symptoms described in the history of present illness. She was evaluated in the context of the global COVID-19 pandemic, which necessitated consideration that the patient might be at risk for infection with the SARS-CoV-2 virus that causes COVID-19. Institutional protocols and algorithms that pertain to the evaluation of patients at risk for COVID-19 are in a state of rapid change based on information released by regulatory bodies including the CDC and federal and state organizations. These policies and algorithms were followed during the patient's care in the ED.   Final Clinical Impressions(s) / ED Diagnoses   Final diagnoses:  Influenza-like illness  Fever in adult  SOB (shortness of breath)  Cough  Diarrhea of presumed infectious origin  Nausea without vomiting    ED Discharge Orders         Ordered    ondansetron (ZOFRAN ODT) 4 MG disintegrating tablet  Every 8 hours PRN     05/19/18 2213           Debroah Baller 05/19/18 2219    Dorie Rank, MD 05/21/18 [phone]

## 2018-05-19 NOTE — ED Triage Notes (Signed)
Pt states she feels terrible, reports chest pain with cough, dizziness, and headache x3 days. Reports prior to today she had diarrhea. Denies diarrhea. Reports chest pain only with cough. States she was evaluated by PCP last week and was instructed to take nyquil and tylenol. States she hasn't taken any cough medicine today.

## 2018-05-20 ENCOUNTER — Encounter: Payer: Self-pay | Admitting: Physician Assistant

## 2018-05-20 ENCOUNTER — Telehealth: Payer: Self-pay | Admitting: Physician Assistant

## 2018-05-20 ENCOUNTER — Ambulatory Visit (INDEPENDENT_AMBULATORY_CARE_PROVIDER_SITE_OTHER): Payer: BLUE CROSS/BLUE SHIELD | Admitting: Physician Assistant

## 2018-05-20 VITALS — Temp 99.8°F

## 2018-05-20 DIAGNOSIS — Z20828 Contact with and (suspected) exposure to other viral communicable diseases: Secondary | ICD-10-CM | POA: Diagnosis not present

## 2018-05-20 DIAGNOSIS — R6889 Other general symptoms and signs: Secondary | ICD-10-CM

## 2018-05-20 NOTE — Patient Instructions (Addendum)
It was great to see you!  You have a viral upper respiratory infection. Antibiotics are not needed for this.  Viral infections usually take 7-10 days to resolve.  The cough can last a few weeks to go away.  If you develop severe shortness of breath, uncontrolled fevers, coughing up blood, confusion, chest pain, or signs of dehydration (such as significantly decreased urine amounts or dizziness with standing) please CALL the ER and then GO to the ER.  Push fluids and get plenty of rest.  Call clinic with questions.  I hope you start feeling better soon!

## 2018-05-20 NOTE — Telephone Encounter (Signed)
See note. Last note appears to be awaiting Samantha's response

## 2018-05-20 NOTE — Telephone Encounter (Signed)
Please schedule Webex.

## 2018-05-20 NOTE — Progress Notes (Signed)
Virtual Visit via Video   I connected with Jill Herrera on 05/20/18 at  3:20 PM EDT by a video enabled telemedicine application and verified that I am speaking with the correct person using two identifiers. Location patient: Home Location provider: [employer], Office Persons participating in the virtual visit: [place], [place], [place], [place], [place]  I discussed the limitations of evaluation and management by telemedicine and the availability of in person appointments. The patient expressed understanding and agreed to proceed.  Subjective:   HPI: Flu-like symptoms Pt having Flu-like symptoms, fever,cough,SOB, diarrhea and nausea. Was seen in the ED on 3/29 and was told she was demonstrating symptoms of COVID-19. 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 Pt having fever 99.8, having nausea but has not started Zofran, will start later on today, taking Tylenol for temp. Pt drinking fluids.  Denies: chest pain, SOB, decreased UOP, dizziness, lightheadedness  ROS: See pertinent positives and negatives per HPI.  Patient Active Problem List   Diagnosis Date Noted  . PCOS (polycystic ovarian syndrome) 02/05/2015    Social History   Tobacco Use  . Smoking status: Never Smoker  . Smokeless tobacco: Never Used  Substance Use Topics  . Alcohol use: Yes    Alcohol/week: 1.0 standard drinks    Types: 1 Glasses of wine per week    Comment: OCC GLASS OF WINE    Current Outpatient Medications:  .  albuterol (PROAIR HFA) 108 (90 Base) MCG/ACT inhaler, Inhale 1-2 puffs into the lungs every 4 (four) hours as needed for wheezing or shortness of breath., Disp: 1 Inhaler, Rfl: 1 .  benzonatate (TESSALON) 200 MG capsule, Take 1 capsule (200 mg total) by mouth 2 (two) times daily as needed for cough., Disp: 20 capsule, Rfl: 0 .  Ferrous Sulfate (IRON PO), Take 1 tablet by mouth daily., Disp: , Rfl:  .   guaiFENesin-codeine 100-10 MG/5ML syrup, Take 10 mLs by mouth every 6 (six) hours as needed for cough., Disp: 120 mL, Rfl: 0 .  Norethindrone Acetate-Ethinyl Estrad-FE (BLISOVI 24 FE) 1-20 MG-MCG(24) tablet, Take 1 tablet by mouth daily., Disp: 3 Package, Rfl: 4 .  ondansetron (ZOFRAN ODT) 4 MG disintegrating tablet, Take 1 tablet (4 mg total) by mouth every 8 (eight) hours as needed for nausea or vomiting., Disp: 10 tablet, Rfl: 0 .  Pseudoeph-Doxylamine-DM-APAP (NYQUIL PO), Take 15 mLs by mouth 2 (two) times daily as needed (cough)., Disp: , Rfl:   No Known Allergies  Objective:   VITALS: Per patient if applicable, see vitals. GENERAL: Alert, appears well and in no acute distress. HEENT: Atraumatic, conjunctiva clear, no obvious abnormalities on inspection of external nose and ears. NECK: Normal movements of the head and neck. CARDIOPULMONARY: No increased WOB. Speaking in clear sentences. I:E ratio WNL.  MS: Moves all visible extremities without noticeable abnormality. PSYCH: Pleasant and cooperative, well-groomed. Speech normal rate and rhythm. Affect is appropriate. Insight and judgement are appropriate. Attention is focused, linear, and appropriate.  NEURO: CN grossly intact. Oriented as arrived to appointment on time with no prompting. Moves both UE equally.  SKIN: No obvious lesions, wounds, erythema, or cyanosis noted on face or hands.  Assessment and Plan:   Kody was seen today for flu like symptoms.  Diagnoses and all orders for this visit:  Flu-like symptoms   I do have high suspicion for COVID given her symptoms and work-up in ER. Unable to test as an  outpatient at this time.  Patient was instructed as follows "If you develop severe shortness of breath, uncontrolled fevers, coughing up blood, confusion, chest pain, or signs of dehydration (such as significantly decreased urine amounts or dizziness with standing) please CALL the ER and then GO to the ER."  . Reviewed  expectations re: course of current medical issues. . Discussed self-management of symptoms. . Outlined signs and symptoms indicating need for more acute intervention. . Patient verbalized understanding and all questions were answered. Marland Kitchen Health Maintenance issues including appropriate healthy diet, exercise, and smoking avoidance were discussed with patient. . See orders for this visit as documented in the electronic medical record.  I discussed the assessment and treatment plan with the patient. The patient was provided an opportunity to ask questions and all were answered. The patient agreed with the plan and demonstrated an understanding of the instructions.   The patient was advised to call back or seek an in-person evaluation if the symptoms worsen or if the condition fails to improve as anticipated.  CMA or LPN served as scribe during this visit. History, Physical, and Plan performed by medical provider. The above documentation has been reviewed and is accurate and complete.   [place], [place] 05/20/2018

## 2018-05-20 NOTE — Telephone Encounter (Signed)
Copied from [place] [id number]. Topic: General - Other >> May 20, 2018 11:30 AM Antonieta Iba C wrote: Reason for CRM: pt called in to be advised. Pt says that she was seen at the hospital and was advised that she is demonstrating  covid-19 symptoms however the hospital didn't have any test to test pt.  Pt says that she was advised by the hospital to call her PCP office and be advised further. Called the office and was advised by Madlyn to send a CRM for call back to advise pt further.   CB: [phone]

## 2018-05-20 NOTE — Telephone Encounter (Signed)
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 Cb# [phone]

## 2018-05-20 NOTE — Telephone Encounter (Signed)
Please call patient and have her make a mychart message so we can discuss over phone. She should not come to the office.

## 2018-05-20 NOTE — Telephone Encounter (Signed)
Please see message and advise if you want to come for drive up testing?

## 2018-05-20 NOTE — Telephone Encounter (Signed)
[facility] [place] Patient Name: Jill Herrera Gender: Female DOB: [birth year]  Age: 28 Y 83 M 25 D Return Phone Number: [phone] (Primary) Address:  City/State/Zip: [place]  [address] Client [place] [facility] [place] [employer], building services [facility] Night Physician Orma Flaming- MD Contact Type Call Who Is Calling Patient / Member / Family / Caregiver Call Type Triage / Clinical Relationship To Patient Self Return Phone Number [phone] (Primary) Chief Complaint CHEST PAIN (>=21 years) - pain, pressure, heaviness or tightness Reason for Call Symptomatic / Request for Health Information Initial Comment saw dr. last friday for respiratory issues, fever 99, loss of appetite, coughing, when coughs chest gets tight, headaches, diarrhea. covid hotline said to contact dr. Doran Stabler No Nurse Assessment Nurse: Dorien Chihuahua, RN, Rosemarie Ax Date/Time (Eastern Time): 05/18/2018 1:24:29 PM Confirm and document reason for call. If symptomatic, describe symptoms. ---Caller states she saw the MD for respiratory problems, nausea, diarrhea, headaches, body aches, fever at night , now is 75F. Flu (-) Taking tylenol daily. Has the patient traveled to [place], [place], [place], [place], or [place] OR had close contact with a person known to have the novel coronavirus illness in the last 14 days? ---No Does the patient have any new or worsening symptoms? ---Yes Will a triage be completed? ---Yes Related visit to physician within the last 2 weeks? ---Yes Does the PT have any chronic conditions? (i.e. diabetes, asthma, this includes High risk factors for pregnancy, etc.) ---No Is the patient pregnant or possibly pregnant? (Ask all females between the ages of 62-55) ---No Is this a behavioral health or substance abuse call? ---No Guidelines Guideline Title Affirmed Question Affirmed  Notes Nurse Date/Time (Eastern Time) Influenza - Seasonal Fever present > 3 days (72 hours)  Dorien Chihuahua, RN, Claudia 05/18/2018 1:31:16 PM Disp. Time Eilene Ghazi Time) Disposition Final User 05/18/2018 1:22:22 PM Send to Urgent Queue Donato Heinz PLEASE NOTE:  All timestamps contained within this report are represented as [place] Standard Time. CONFIDENTIALTY NOTICE: This fax transmission is intended only for the addressee.  It contains information that is legally privileged, confidential or otherwise protected from use or disclosure.  If you are not the intended recipient, you are strictly prohibited from reviewing, disclosing, copying using or disseminating any of this information or taking any action in reliance on or regarding this information.  If you have received this fax in error, please notify us immediately by telephone so that we can arrange for its return to [place]. Phone:  [phone], Toll-Free:  [phone], Fax:  [phone] Page: 2 of 2 Call Id: [id number] 05/18/2018 1:36:24 PM See PCP within 24 Hours Yes Dorien Chihuahua, RN, Jari Favre Disagree/Comply Comply Caller Understands Yes PreDisposition Did not know what to do Care Advice Given Per Guideline SEE PCP WITHIN 24 HOURS: * IF OFFICE WILL BE CLOSED AND NO PCP (PRIMARY CARE PROVIDER) SECONDLEVEL TRIAGE: You need to be seen within the next 24 hours. A clinic or an urgent care center is often a good source of care if your doctor's office is closed or you can't get an appointment. CALL BACK IF: * Fever over 104 F (40 C) * Difficulty breathing occurs * You become worse. CARE ADVICE given per INFLUENZA - SEASONAL (Adult) guideline.

## 2018-06-26 ENCOUNTER — Telehealth: Payer: Self-pay | Admitting: *Deleted

## 2018-06-26 NOTE — Telephone Encounter (Signed)
Pt called back and I informed she would need virtual visit for refill. Pt stated she does not need refill.

## 2018-06-26 NOTE — Telephone Encounter (Signed)
Left message on voicemail to call office. Received refill request for Albuterol. Needs virtual visit with [facility].

## 2018-07-10 ENCOUNTER — Telehealth: Payer: Self-pay

## 2018-07-10 NOTE — Telephone Encounter (Signed)
FYI, see message. 

## 2018-07-10 NOTE — Telephone Encounter (Signed)
Noted. Yes, needs virtual visit to discuss.

## 2018-07-10 NOTE — Telephone Encounter (Signed)
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 She says the doctor at the facility told her to call to be tested. I advised since she has a PCP, she will need to have a virtual visit. She says she's trying not to do that since she was recently seen. I called the office and spoke to [facility] just to make sure the process is required for a virtual visit, she says yes. I advised the patient and she says she will talk to the doctor at her facility and see what other option it is, I advised to call back if needed.

## 2018-09-26 DIAGNOSIS — Z1159 Encounter for screening for other viral diseases: Secondary | ICD-10-CM | POA: Diagnosis not present

## 2018-11-21 DIAGNOSIS — Z03818 Encounter for observation for suspected exposure to other biological agents ruled out: Secondary | ICD-10-CM | POA: Diagnosis not present

## 2018-12-28 DIAGNOSIS — Z03818 Encounter for observation for suspected exposure to other biological agents ruled out: Secondary | ICD-10-CM | POA: Diagnosis not present

## 2019-01-02 DIAGNOSIS — Z03818 Encounter for observation for suspected exposure to other biological agents ruled out: Secondary | ICD-10-CM | POA: Diagnosis not present

## 2019-01-08 DIAGNOSIS — Z03818 Encounter for observation for suspected exposure to other biological agents ruled out: Secondary | ICD-10-CM | POA: Diagnosis not present

## 2019-01-20 DIAGNOSIS — Z03818 Encounter for observation for suspected exposure to other biological agents ruled out: Secondary | ICD-10-CM | POA: Diagnosis not present

## 2019-01-27 DIAGNOSIS — Z03818 Encounter for observation for suspected exposure to other biological agents ruled out: Secondary | ICD-10-CM | POA: Diagnosis not present

## 2019-02-03 DIAGNOSIS — Z03818 Encounter for observation for suspected exposure to other biological agents ruled out: Secondary | ICD-10-CM | POA: Diagnosis not present

## 2019-02-10 DIAGNOSIS — Z03818 Encounter for observation for suspected exposure to other biological agents ruled out: Secondary | ICD-10-CM | POA: Diagnosis not present

## 2019-03-05 DIAGNOSIS — Z03818 Encounter for observation for suspected exposure to other biological agents ruled out: Secondary | ICD-10-CM | POA: Diagnosis not present

## 2019-03-10 DIAGNOSIS — Z03818 Encounter for observation for suspected exposure to other biological agents ruled out: Secondary | ICD-10-CM | POA: Diagnosis not present

## 2019-03-17 DIAGNOSIS — Z03818 Encounter for observation for suspected exposure to other biological agents ruled out: Secondary | ICD-10-CM | POA: Diagnosis not present

## 2019-03-24 DIAGNOSIS — Z03818 Encounter for observation for suspected exposure to other biological agents ruled out: Secondary | ICD-10-CM | POA: Diagnosis not present

## 2019-03-28 ENCOUNTER — Encounter: Payer: BLUE CROSS/BLUE SHIELD | Admitting: Obstetrics & Gynecology

## 2019-04-08 ENCOUNTER — Encounter: Payer: Self-pay | Admitting: Obstetrics & Gynecology

## 2019-04-08 ENCOUNTER — Other Ambulatory Visit: Payer: Self-pay

## 2019-04-08 ENCOUNTER — Ambulatory Visit (INDEPENDENT_AMBULATORY_CARE_PROVIDER_SITE_OTHER): Payer: BC Managed Care – PPO | Admitting: Obstetrics & Gynecology

## 2019-04-08 VITALS — Ht 61.0 in | Wt 168.0 lb

## 2019-04-08 DIAGNOSIS — E6609 Other obesity due to excess calories: Secondary | ICD-10-CM | POA: Diagnosis not present

## 2019-04-08 DIAGNOSIS — Z113 Encounter for screening for infections with a predominantly sexual mode of transmission: Secondary | ICD-10-CM

## 2019-04-08 DIAGNOSIS — Z01419 Encounter for gynecological examination (general) (routine) without abnormal findings: Secondary | ICD-10-CM

## 2019-04-08 DIAGNOSIS — Z3041 Encounter for surveillance of contraceptive pills: Secondary | ICD-10-CM

## 2019-04-08 DIAGNOSIS — Z6831 Body mass index (BMI) 31.0-31.9, adult: Secondary | ICD-10-CM

## 2019-04-08 MED ORDER — BLISOVI 24 FE 1-20 MG-MCG(24) PO TABS: 1.0000 | ORAL_TABLET | Freq: Every day | ORAL | 4 refills | Status: DC

## 2019-04-08 NOTE — Progress Notes (Signed)
Jill Herrera 1990/12/28 PO:[id number]   History:    29 y.o. G0 Married  RP:  Established patient presenting for annual gyn exam   HPI: Patient is on low-dose birth control pills Blisovi 24 FE 1/20.  Planning to start attempting conception about 01/2020 when husband comes to [place] from [place].  No pelvic pain.   No breakthrough bleeding.  Vaginal secretions present, no itching or odor.  Breast normal.  Body mass index 31.74.  Exercises regularly.   Past medical history,surgical history, family history and social history were all reviewed and documented in the EPIC chart.  Gynecologic History Patient's last menstrual period was 04/04/2019.  Obstetric History OB History  Gravida Para Term Preterm AB Living  0 0 0 0 0 0  SAB TAB Ectopic Multiple Live Births  0 0 0 0 0     ROS: A ROS was performed and pertinent positives and negatives are included in the history.  GENERAL: No fevers or chills. HEENT: No change in vision, no earache, sore throat or sinus congestion. NECK: No pain or stiffness. CARDIOVASCULAR: No chest pain or pressure. No palpitations. PULMONARY: No shortness of breath, cough or wheeze. GASTROINTESTINAL: No abdominal pain, nausea, vomiting or diarrhea, melena or bright red blood per rectum. GENITOURINARY: No urinary frequency, urgency, hesitancy or dysuria. MUSCULOSKELETAL: No joint or muscle pain, no back pain, no recent trauma. DERMATOLOGIC: No rash, no itching, no lesions. ENDOCRINE: No polyuria, polydipsia, no heat or cold intolerance. No recent change in weight. HEMATOLOGICAL: No anemia or easy bruising or bleeding. NEUROLOGIC: No headache, seizures, numbness, tingling or weakness. PSYCHIATRIC: No depression, no loss of interest in normal activity or change in sleep pattern.     Exam:   Ht 5\' 1"  (1.549 m)   Wt 168 lb (76.2 kg)   LMP 04/04/2019   BMI 31.74 kg/m   Body mass index is 31.74 kg/m.  General appearance : Well developed well nourished female. No  acute distress HEENT: Eyes: no retinal hemorrhage or exudates,  Neck supple, trachea midline, no carotid bruits, no thyroidmegaly Lungs: Clear to auscultation, no rhonchi or wheezes, or rib retractions  Heart: Regular rate and rhythm, no murmurs or gallops Breast:Examined in sitting and supine position were symmetrical in appearance, no palpable masses or tenderness,  no skin retraction, no nipple inversion, no nipple discharge, no skin discoloration, no axillary or supraclavicular lymphadenopathy Abdomen: no palpable masses or tenderness, no rebound or guarding Extremities: no edema or skin discoloration or tenderness  Pelvic: Vulva: Normal             Vagina: No gross lesions or discharge  Cervix: No gross lesions or discharge.  Pap/HPV HR done.  Uterus  AV, normal size, shape and consistency, non-tender and mobile  Adnexa  Without masses or tenderness  Anus: Normal   Assessment/Plan:  29 y.o. female for annual exam   1. Encounter for routine gynecological examination with Papanicolaou smear of cervix Normal gynecologic exam.  Pap test with high-risk HPV done today.  Breast exam normal.  2. Encounter for surveillance of contraceptive pills Well on Blisovi 24 FE 1/20.  No contraindication to continue.  Patient will stop when ready to attempt conception.  At that time will start on prenatal vitamins.  Recommend weight loss and improved fitness until then.  3. Class 1 obesity due to excess calories without serious comorbidity with body mass index (BMI) of 31.0 to 31.9 in adult Lower calorie/carb diet such as [employer] recommended.  Aerobic  physical activities 5 times a week and light weightlifting every 2 days recommended.  Other orders - Norethindrone Acetate-Ethinyl Estrad-FE (BLISOVI 24 FE) 1-20 MG-MCG(24) tablet; Take 1 tablet by mouth daily.  Princess Bruins MD, 4:07 PM 04/08/2019

## 2019-04-09 DIAGNOSIS — Z01419 Encounter for gynecological examination (general) (routine) without abnormal findings: Secondary | ICD-10-CM | POA: Diagnosis not present

## 2019-04-09 DIAGNOSIS — R87618 Other abnormal cytological findings on specimens from cervix uteri: Secondary | ICD-10-CM | POA: Diagnosis not present

## 2019-04-10 LAB — PAP, TP IMAGING W/ HPV RNA, RFLX HPV TYPE 16,18/45: HPV DNA High Risk: NOT DETECTED

## 2019-04-11 ENCOUNTER — Encounter: Payer: Self-pay | Admitting: Obstetrics & Gynecology

## 2019-04-11 NOTE — Patient Instructions (Signed)
1. Encounter for routine gynecological examination with Papanicolaou smear of cervix Normal gynecologic exam.  Pap test with high-risk HPV done today.  Breast exam normal.  2. Encounter for surveillance of contraceptive pills Well on Blisovi 24 FE 1/20.  No contraindication to continue.  Patient will stop when ready to attempt conception.  At that time will start on prenatal vitamins.  Recommend weight loss and improved fitness until then.  3. Class 1 obesity due to excess calories without serious comorbidity with body mass index (BMI) of 31.0 to 31.9 in adult Lower calorie/carb diet such as [employer] recommended.  Aerobic physical activities 5 times a week and light weightlifting every 2 days recommended.  Other orders - Norethindrone Acetate-Ethinyl Estrad-FE (BLISOVI 24 FE) 1-20 MG-MCG(24) tablet; Take 1 tablet by mouth daily.  Jill Herrera, it was a pleasure seeing you today!  I will inform you of your results as soon as they are available.

## 2019-06-19 IMAGING — DX PORTABLE CHEST - 1 VIEW
1 series · 1 of 1 positions shown · non-contrast
Comparison: September 13, 2009

CLINICAL DATA: Chest pain.

EXAM:
PORTABLE CHEST 1 VIEW

[chest ap]
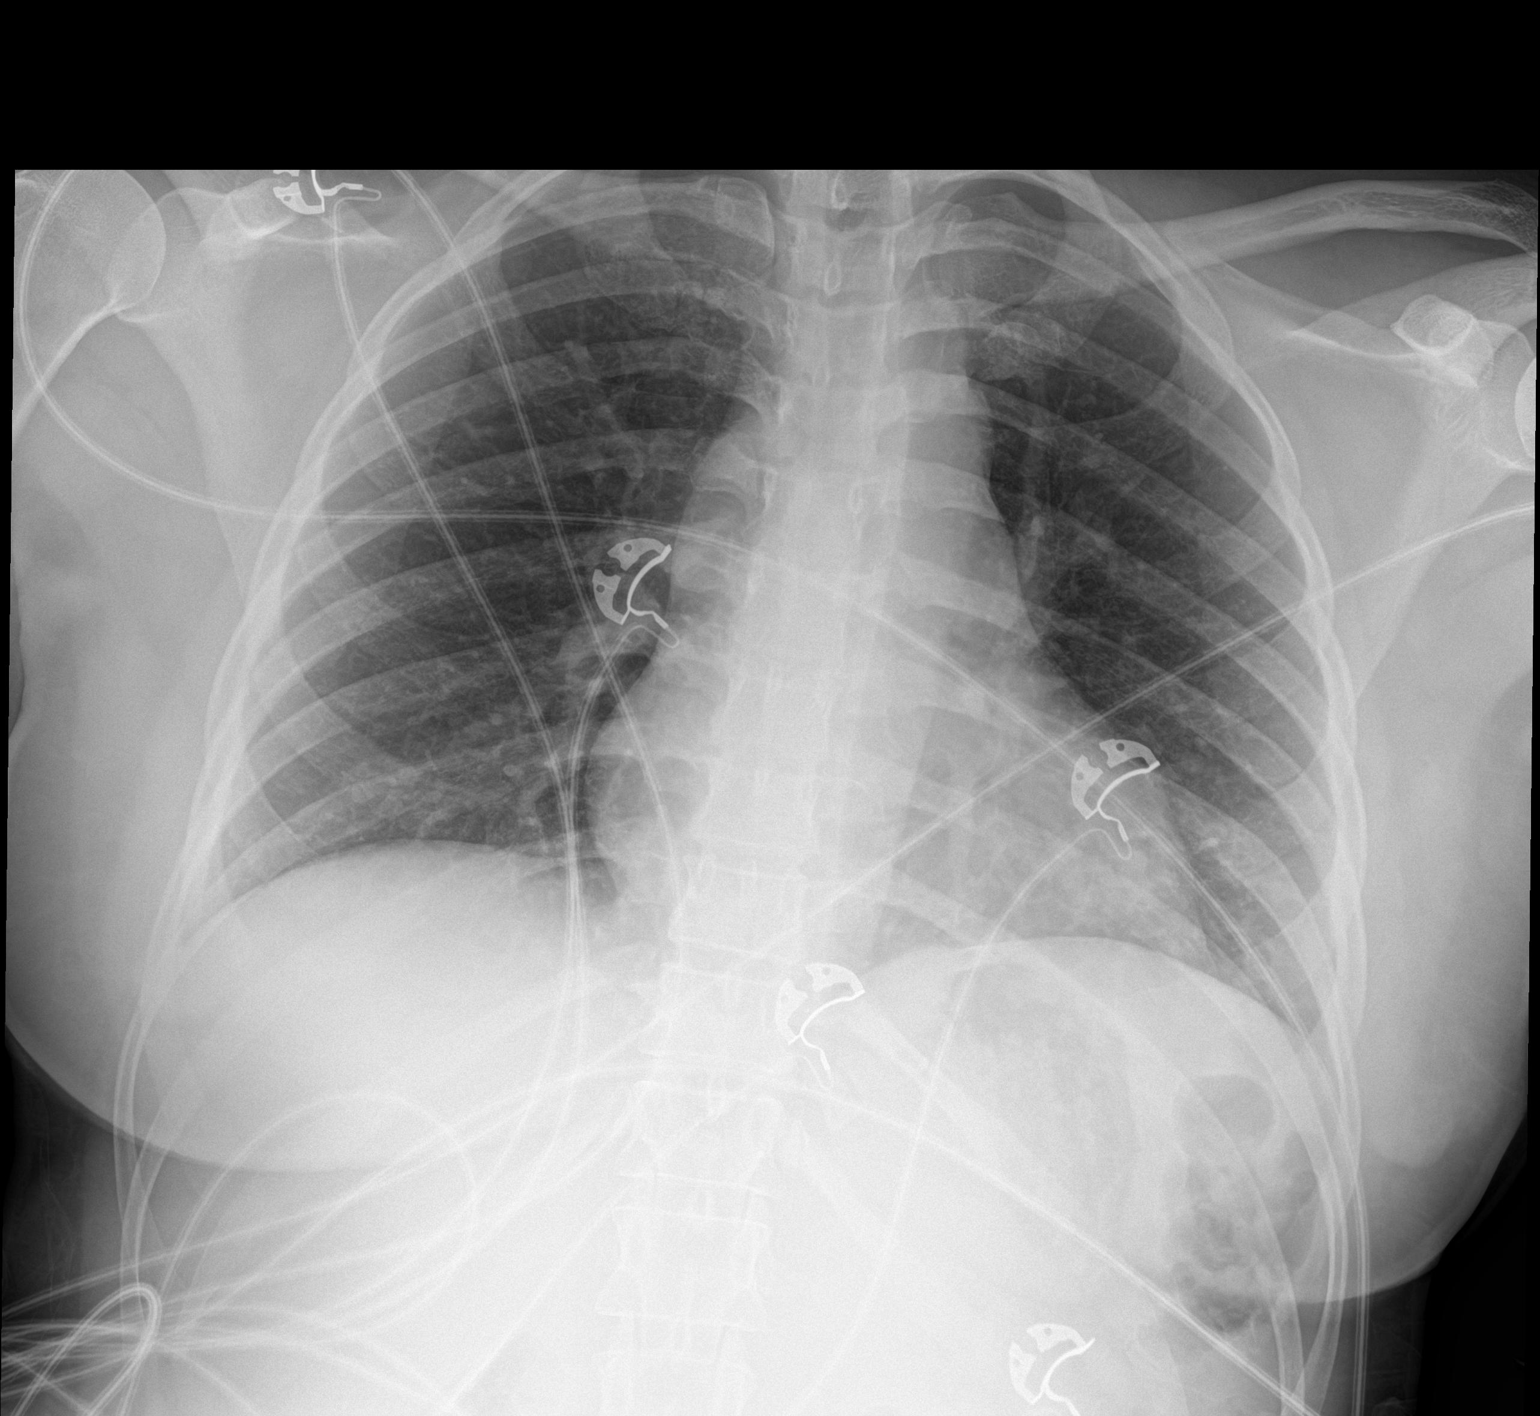

[1 of 1 positions shown; findings below may reference images not displayed]

FINDINGS: The cardiac silhouette is mildly enlarged. However, cardiac
silhouette size is not well evaluated on a portable film. The hila,
mediastinum, lungs, and pleura are otherwise normal.
IMPRESSION: Mild prominence the cardiac silhouette may be due to the low volume
portable technique. No other abnormalities.

## 2019-07-02 ENCOUNTER — Telehealth: Payer: Self-pay | Admitting: Physician Assistant

## 2019-07-02 NOTE — Telephone Encounter (Signed)
Pt called stating she is traveling to [place] in 2 weeks. Pt asked if she needs to make an appointment to get a vaccine for yellow fever or anything else she may need. Pt is also requesting anti-malaria drugs for her trip. Please advise.

## 2019-07-02 NOTE — Telephone Encounter (Signed)
Please tell patient to make an appointment with the Health Department or [facility] Travel Dept

## 2019-07-02 NOTE — Telephone Encounter (Signed)
Spoke to pt told her she will need to contact the [facility] or [facility] regarding yellow fever vaccine. Told pt I have she is due for a tetanus vaccine I don't have a record of recent on. Pt said she is pretty sure she is up to date, she will check her records. Asked pt when she had her second COVID-19 vaccine? Pt said Feb 12th, updated chart. Told pt Aldona Bar will send in Malaria medication for her. Pt verbalized understanding. Pharmacy updated. Told pt if she finds out she needs a tetanus shot just call and schedule a nurse visit. Pt verbalized understanding.

## 2019-07-02 NOTE — Telephone Encounter (Signed)
Please see message and advise. For yellow fever she will have to go to the Health Dept or [facility] Travel Dept.

## 2019-07-02 NOTE — Telephone Encounter (Signed)
Please send in Malaria medication.

## 2019-07-04 ENCOUNTER — Other Ambulatory Visit: Payer: Self-pay | Admitting: Physician Assistant

## 2019-07-04 MED ORDER — DOXYCYCLINE HYCLATE 100 MG PO TABS: 100.0000 mg | ORAL_TABLET | Freq: Every day | ORAL | 0 refills | Status: DC

## 2019-07-04 NOTE — Telephone Encounter (Signed)
Sent!

## 2019-07-04 NOTE — Telephone Encounter (Signed)
Please see message. °

## 2019-07-04 NOTE — Telephone Encounter (Signed)
Noted, pt aware you were sending.

## 2019-07-04 NOTE — Telephone Encounter (Signed)
Spoke to pt, asked her how long will you be gone for? Pt said 17 days. Leaves on 5/21 and returns on 6/8. Told her okay Aldona Bar will send Malaria medication into the pharmacy for you. Pt verbalized understanding.

## 2019-07-04 NOTE — Telephone Encounter (Signed)
I will also need to know how long she will be gone for so I send in the appropriate amount of medication.

## 2019-07-09 DIAGNOSIS — Z03818 Encounter for observation for suspected exposure to other biological agents ruled out: Secondary | ICD-10-CM | POA: Diagnosis not present

## 2019-07-27 ENCOUNTER — Other Ambulatory Visit: Payer: Self-pay | Admitting: Physician Assistant

## 2019-09-22 ENCOUNTER — Other Ambulatory Visit: Payer: Self-pay | Admitting: Physician Assistant

## 2019-11-05 DIAGNOSIS — Z23 Encounter for immunization: Secondary | ICD-10-CM | POA: Diagnosis not present

## 2019-11-18 ENCOUNTER — Other Ambulatory Visit: Payer: Self-pay | Admitting: Physician Assistant

## 2020-04-08 ENCOUNTER — Encounter: Payer: BC Managed Care – PPO | Admitting: Obstetrics & Gynecology

## 2020-04-08 ENCOUNTER — Ambulatory Visit (INDEPENDENT_AMBULATORY_CARE_PROVIDER_SITE_OTHER): Payer: BC Managed Care – PPO | Admitting: Obstetrics & Gynecology

## 2020-04-08 ENCOUNTER — Other Ambulatory Visit: Payer: Self-pay

## 2020-04-08 ENCOUNTER — Encounter: Payer: Self-pay | Admitting: Obstetrics & Gynecology

## 2020-04-08 VITALS — BP 118/76 | Ht 61.5 in | Wt 168.0 lb

## 2020-04-08 DIAGNOSIS — Z3169 Encounter for other general counseling and advice on procreation: Secondary | ICD-10-CM

## 2020-04-08 DIAGNOSIS — Z01419 Encounter for gynecological examination (general) (routine) without abnormal findings: Secondary | ICD-10-CM

## 2020-04-08 DIAGNOSIS — Z6831 Body mass index (BMI) 31.0-31.9, adult: Secondary | ICD-10-CM

## 2020-04-08 DIAGNOSIS — E6609 Other obesity due to excess calories: Secondary | ICD-10-CM | POA: Diagnosis not present

## 2020-04-08 NOTE — Progress Notes (Signed)
    Jill Herrera 10/15/1990 [id number]   History:    30 y.o. G0 Married  [id number] presenting for annual gyn exam   JIR:CVELFYB stopped the birth control pills Blisovi 24 FE 1/20 in 10/2019.  Menses regular normal every month.  No BTB.  Planning to start attempting conception in 07/2020 when husband comes to [place] from [place].  No pelvic pain.  No breakthrough bleeding. Vaginal secretions present, no itching or odor. Breast normal. Body mass index 31.23. Exercises regularly.  Past medical history,surgical history, family history and social history were all reviewed and documented in the EPIC chart.  Gynecologic History Patient's last menstrual period was 03/14/2020.  Obstetric History OB History  Gravida Para Term Preterm AB Living  0 0 0 0 0 0  SAB IAB Ectopic Multiple Live Births  0 0 0 0 0     ROS: A ROS was performed and pertinent positives and negatives are included in the history.  GENERAL: No fevers or chills. HEENT: No change in vision, no earache, sore throat or sinus congestion. NECK: No pain or stiffness. CARDIOVASCULAR: No chest pain or pressure. No palpitations. PULMONARY: No shortness of breath, cough or wheeze. GASTROINTESTINAL: No abdominal pain, nausea, vomiting or diarrhea, melena or bright red blood per rectum. GENITOURINARY: No urinary frequency, urgency, hesitancy or dysuria. MUSCULOSKELETAL: No joint or muscle pain, no back pain, no recent trauma. DERMATOLOGIC: No rash, no itching, no lesions. ENDOCRINE: No polyuria, polydipsia, no heat or cold intolerance. No recent change in weight. HEMATOLOGICAL: No anemia or easy bruising or bleeding. NEUROLOGIC: No headache, seizures, numbness, tingling or weakness. PSYCHIATRIC: No depression, no loss of interest in normal activity or change in sleep pattern.     Exam:   Ht 5' 1.5" (1.562 m)   Wt 168 lb (76.2 kg)   LMP 03/14/2020 Comment: no birth control   BMI 31.23 kg/m   Body mass index is  31.23 kg/m.  General appearance : Well developed well nourished female. No acute distress HEENT: Eyes: no retinal hemorrhage or exudates,  Neck supple, trachea midline, no carotid bruits, no thyroidmegaly Lungs: Clear to auscultation, no rhonchi or wheezes, or rib retractions  Heart: Regular rate and rhythm, no murmurs or gallops Breast:Examined in sitting and supine position were symmetrical in appearance, no palpable masses or tenderness,  no skin retraction, no nipple inversion, no nipple discharge, no skin discoloration, no axillary or supraclavicular lymphadenopathy Abdomen: no palpable masses or tenderness, no rebound or guarding Extremities: no edema or skin discoloration or tenderness  Pelvic: Vulva: Normal             Vagina: No gross lesions or discharge  Cervix: No gross lesions or discharge.  Pap reflex done.  Uterus  AV, normal size, shape and consistency, non-tender and mobile  Adnexa  Without masses or tenderness  Anus: Normal   Assessment/Plan:  30 y.o. female for annual exam   1. Encounter for routine gynecological examination with Papanicolaou smear of cervix Normal gynecologic exam.  Pap reflex done.  Breast exam normal.    2. Encounter for preconception consultation Recommend starting PNV now.  Nutrition and fitness reviewed.  3. Class 1 obesity due to excess calories without serious comorbidity with body mass index (BMI) of 31.0 to 31.9 in adult BMI 31.23.  Recommend a lower calorie/carb diet with plenty of vegetables.  Fitness with aerobic activities 5 times a week and light weight lifting every 2 days.  Princess Bruins MD, 9:43 AM 04/08/2020

## 2020-04-08 NOTE — Addendum Note (Signed)
Addended by: Thurnell Garbe A on: 04/08/2020 10:08 AM   Modules accepted: Orders

## 2020-04-09 LAB — PAP IG W/ RFLX HPV ASCU

## 2020-11-01 ENCOUNTER — Encounter: Payer: Self-pay | Admitting: Nurse Practitioner

## 2020-11-01 ENCOUNTER — Other Ambulatory Visit: Payer: Self-pay

## 2020-11-01 ENCOUNTER — Ambulatory Visit (INDEPENDENT_AMBULATORY_CARE_PROVIDER_SITE_OTHER): Payer: BC Managed Care – PPO | Admitting: Nurse Practitioner

## 2020-11-01 VITALS — BP 116/76

## 2020-11-01 DIAGNOSIS — Z3491 Encounter for supervision of normal pregnancy, unspecified, first trimester: Secondary | ICD-10-CM

## 2020-11-01 DIAGNOSIS — N912 Amenorrhea, unspecified: Secondary | ICD-10-CM | POA: Diagnosis not present

## 2020-11-01 DIAGNOSIS — Z3201 Encounter for pregnancy test, result positive: Secondary | ICD-10-CM

## 2020-11-01 LAB — PREGNANCY, URINE: Preg Test, Ur: POSITIVE — AB

## 2020-11-01 NOTE — Progress Notes (Signed)
   Acute Office Visit  Subjective:    Patient ID: Jill Herrera, female    DOB: 1990/07/24, 30 y.o.   MRN: [id number]   HPI 30 y.o. presents today for positive home pregnancy test. Excited for pregnancy with supportive partner. Has occasional cramping, no spotting. Taking prenatal vitamin. LMP 09/21/2020.    Review of Systems  Constitutional: Negative.   Genitourinary: Negative.       Objective:    Physical Exam Constitutional:      Appearance: Normal appearance.  GU: Not indicated  BP 116/76   LMP 09/21/2020  Wt Readings from Last 3 Encounters:  04/08/20 168 lb (76.2 kg)  04/08/19 168 lb (76.2 kg)  05/19/18 161 lb (73 kg)   UPT positive     Assessment & Plan:   Problem List Items Addressed This Visit   None Visit Diagnoses     First trimester pregnancy    -  Primary   Amenorrhea       Relevant Orders   Pregnancy, urine (Completed)       Plan: Congratulated on pregnancy. Discussed pregnancy-safe behaviors and importance of prenatal vitamin. Reassured that some mild cramping and spotting is normal during pregnancy, especially during first trimester. Warning signs discussed. Provided her with a list of local OB providers and encouraged to establish soon.  All questions answered.     [place], 4:42 PM 11/01/2020

## 2020-11-16 DIAGNOSIS — O219 Vomiting of pregnancy, unspecified: Secondary | ICD-10-CM | POA: Diagnosis not present

## 2020-11-16 DIAGNOSIS — N911 Secondary amenorrhea: Secondary | ICD-10-CM | POA: Diagnosis not present

## 2020-11-16 DIAGNOSIS — Z23 Encounter for immunization: Secondary | ICD-10-CM | POA: Diagnosis not present

## 2020-11-16 DIAGNOSIS — Z3201 Encounter for pregnancy test, result positive: Secondary | ICD-10-CM | POA: Diagnosis not present

## 2020-11-24 DIAGNOSIS — Z369 Encounter for antenatal screening, unspecified: Secondary | ICD-10-CM | POA: Diagnosis not present

## 2020-11-24 DIAGNOSIS — Z3401 Encounter for supervision of normal first pregnancy, first trimester: Secondary | ICD-10-CM | POA: Diagnosis not present

## 2020-11-24 DIAGNOSIS — Z113 Encounter for screening for infections with a predominantly sexual mode of transmission: Secondary | ICD-10-CM | POA: Diagnosis not present

## 2020-11-24 DIAGNOSIS — O26891 Other specified pregnancy related conditions, first trimester: Secondary | ICD-10-CM | POA: Diagnosis not present

## 2020-11-24 DIAGNOSIS — Z3A09 9 weeks gestation of pregnancy: Secondary | ICD-10-CM | POA: Diagnosis not present

## 2020-11-24 LAB — OB RESULTS CONSOLE RPR: RPR: NONREACTIVE

## 2020-11-24 LAB — OB RESULTS CONSOLE GC/CHLAMYDIA
Chlamydia: NEGATIVE
Gonorrhea: NEGATIVE

## 2020-11-24 LAB — HEPATITIS C ANTIBODY: HCV Ab: NEGATIVE

## 2020-11-24 LAB — OB RESULTS CONSOLE HEPATITIS B SURFACE ANTIGEN: Hepatitis B Surface Ag: NEGATIVE

## 2020-11-24 LAB — OB RESULTS CONSOLE ABO/RH: RH Type: POSITIVE

## 2020-11-24 LAB — OB RESULTS CONSOLE RUBELLA ANTIBODY, IGM: Rubella: IMMUNE

## 2020-11-24 LAB — OB RESULTS CONSOLE HIV ANTIBODY (ROUTINE TESTING): HIV: NONREACTIVE

## 2021-02-01 DIAGNOSIS — Z3A19 19 weeks gestation of pregnancy: Secondary | ICD-10-CM | POA: Diagnosis not present

## 2021-02-01 DIAGNOSIS — Z363 Encounter for antenatal screening for malformations: Secondary | ICD-10-CM | POA: Diagnosis not present

## 2021-02-01 DIAGNOSIS — Z3402 Encounter for supervision of normal first pregnancy, second trimester: Secondary | ICD-10-CM | POA: Diagnosis not present

## 2021-05-26 ENCOUNTER — Telehealth (HOSPITAL_COMMUNITY): Payer: Self-pay | Admitting: *Deleted

## 2021-05-26 ENCOUNTER — Encounter (HOSPITAL_COMMUNITY): Payer: Self-pay | Admitting: *Deleted

## 2021-05-26 NOTE — Telephone Encounter (Signed)
Preadmission screen  

## 2021-05-26 NOTE — Patient Instructions (Signed)
Filicia Trentman ? 05/26/2021 ? ? Your procedure is scheduled on:  06/09/2021 ? Arrive at [facility] [place] on [address] at [facility]  and [address]. You are invited to use the FREE valet parking or use the Visitor's parking deck. ? Pick up the phone at the desk and dial [phone]. ? Call this number if you have problems the morning of surgery: [phone] ? Remember: ? ? Do not eat food:(After Midnight) Desp?s de medianoche. ? Do not drink clear liquids: (After Midnight) Desp?s de medianoche. ? Take these medicines the morning of surgery with A SIP OF WATER:  none ? ? Do not wear jewelry, make-up or nail polish. ? Do not wear lotions, powders, or perfumes. Do not wear deodorant. ? Do not shave 48 hours prior to surgery. ? Do not bring valuables to the hospital.  [facility] is not  ? responsible for any belongings or valuables brought to the hospital. ? Contacts, dentures or bridgework may not be worn into surgery. ? Leave suitcase in the car. After surgery it may be brought to your room. ? For patients admitted to the hospital, checkout time is 11:00 AM the day of  ?            discharge. ? ?   ? Please read over the following fact sheets that you were given:  ?   Preparing for Surgery ? ? ?

## 2021-05-27 ENCOUNTER — Encounter (HOSPITAL_COMMUNITY): Payer: Self-pay

## 2021-06-07 ENCOUNTER — Encounter (HOSPITAL_COMMUNITY)
Admission: RE | Admit: 2021-06-07 | Discharge: 2021-06-07 | Disposition: A | Discharge status: Home / Self Care | Payer: BC Managed Care – PPO | Source: Ambulatory Visit | Attending: Obstetrics and Gynecology | Admitting: Obstetrics and Gynecology

## 2021-06-07 DIAGNOSIS — Z01812 Encounter for preprocedural laboratory examination: Secondary | ICD-10-CM | POA: Insufficient documentation

## 2021-06-07 DIAGNOSIS — O34218 Maternal care for other type scar from previous cesarean delivery: Secondary | ICD-10-CM | POA: Insufficient documentation

## 2021-06-07 LAB — TYPE AND SCREEN
ABO/RH(D): O POS
Antibody Screen: NEGATIVE

## 2021-06-07 LAB — CBC
HCT: 32.1 % — ABNORMAL LOW (ref 36.0–46.0)
Hemoglobin: 10.6 g/dL — ABNORMAL LOW (ref 12.0–15.0)
MCH: 24.3 pg — ABNORMAL LOW (ref 26.0–34.0)
MCHC: 33 g/dL (ref 30.0–36.0)
MCV: 73.5 fL — ABNORMAL LOW (ref 80.0–100.0)
Platelets: 305 10*3/uL (ref 150–400)
RBC: 4.37 MIL/uL (ref 3.87–5.11)
RDW: 15.1 % (ref 11.5–15.5)
WBC: 4.5 10*3/uL (ref 4.0–10.5)
nRBC: 0.4 % — ABNORMAL HIGH (ref 0.0–0.2)

## 2021-06-07 LAB — RPR: RPR Ser Ql: NONREACTIVE

## 2021-06-08 ENCOUNTER — Encounter (HOSPITAL_COMMUNITY): Payer: Self-pay | Admitting: Obstetrics and Gynecology

## 2021-06-08 NOTE — H&P (Signed)
Jill Herrera is a 31 y.o.prime female presenting at 28 2/7wks for scheduled primary cesarean section. 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 ?She is dated per LMP which was confirmed with a 9week [place].  ?Her pregnancy has been complicated by polyhydramnios which resolved at [redacted] weeks gestation, BMI 31 at beginning of pregnancy. 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 She declined NIPT ?OB History   ? ? Gravida  ?1  ? Para  ?0  ? Term  ?0  ? Preterm  ?0  ? AB  ?0  ? Living  ?0  ?  ? ? SAB  ?0  ? IAB  ?0  ? Ectopic  ?0  ? Multiple  ?0  ? Live Births  ?0  ?   ?  ?  ? ?Past Medical History:  ?Diagnosis Date  ? Anemia   ? Fibroid of cervix   ? ?Past Surgical History:  ?Procedure Laterality Date  ? ROBOT ASSISTED MYOMECTOMY N/A 06/2011  ? UTERINE FIBROID SURGERY    ? ?Family History: family history includes Asthma in her mother; Diabetes in her mother; Miscarriages / [place] in her mother. ?Social History:  reports that she has never smoked. She has never used smokeless tobacco. She reports current alcohol use of about 1.0 standard drink per week. She reports that she does not use drugs. ? ? ?  ?Maternal Diabetes: No ?Genetic Screening: Declined ?Maternal Ultrasounds/Referrals: Normal ?Fetal Ultrasounds or other Referrals:  None ?Maternal Substance Abuse:  No ?Significant Maternal Medications:  None ?Significant Maternal Lab Results:  Group B Strep positive ?Other Comments:  None ? ?Review of Systems  ?Constitutional:  Negative for activity change, fatigue and fever.  ?Eyes:  Negative for photophobia and visual disturbance.  ?Respiratory:  Negative for chest tightness and shortness of breath.   ?Cardiovascular:  Positive for leg swelling. Negative for chest pain and palpitations.  ?Gastrointestinal:  Negative for abdominal pain.  ?Genitourinary:  Negative for pelvic pain.  ?Musculoskeletal:  Positive for back pain.  ?Neurological:  Negative for  light-headedness and headaches.  ?Psychiatric/Behavioral:  The patient is nervous/anxious.   ?Maternal Medical History:  ?Reason for admission: Scheduled primary cesarean section  ? ?Contractions: Frequency: rare.   ?Fetal activity: Perceived fetal activity is normal.   ?Prenatal complications: no prenatal complications ?Prenatal Complications - Diabetes: none. ? ?  ?Last menstrual period 09/21/2020. ?Maternal Exam:  ?Uterine Assessment: Contraction strength is mild.  Contraction frequency is rare.  ?Abdomen: Patient reports generalized tenderness.  ?Estimated fetal weight is AGA.   ?Fetal presentation: vertex ?Introitus: Normal vulva. Vulva is negative for condylomata and lesion.  ?Normal vagina.  Vagina is negative for condylomata.  ? ? ?Fetal Exam ?Fetal Monitor Review: Baseline rate: 140.  ?Pattern: accelerations present.   ? ?Physical Exam ?Vitals and nursing note reviewed. Exam conducted with a chaperone present.  ?Constitutional:   ?   Appearance: Normal appearance.  ?Cardiovascular:  ?   Rate and Rhythm: Normal rate.  ?Pulmonary:  ?   Effort: Pulmonary effort is normal.  ?Abdominal:  ?   Tenderness: There is generalized abdominal tenderness.  ?Genitourinary: ?   General: Normal vulva.  ?Vulva is no lesion.  ?Musculoskeletal:     ?   General: Normal range of motion.  ?   Cervical back: Normal range of motion.  ?Skin: ?   General: Skin is warm.  ?   Capillary Refill: Capillary refill takes 2 to 3  seconds.  ?Neurological:  ?   General: No focal deficit present.  ?   Mental Status: She is alert and oriented to person, place, and time. Mental status is at baseline.  ?Psychiatric:     ?   Mood and Affect: Mood normal.     ?   Behavior: Behavior normal.     ?   Thought Content: Thought content normal.  ?  ?Prenatal labs: ?ABO, Rh: --/--/O POS (04/18 0915) ?Antibody: NEG (04/18 0915) ?Rubella: Immune (10/05 0000) ?RPR: NON REACTIVE (04/18 0915)  ?HBsAg: Negative (10/05 0000)  ?HIV: Non-reactive (10/05 0000)  ?GBS:     ? ?Assessment/Plan: ?31yo G1P0 female here for scheduled primary cesarean section due to history of large robotic myomectomy in broad ligament ?- Admit ?- ERAS protocol ?- Verify consent  ?- To OR when ready   ? ?Isaiah Serge ?06/08/2021, 10:42 AM ? ? ? ? ?

## 2021-06-09 ENCOUNTER — Encounter (HOSPITAL_COMMUNITY)
Admission: RE | Disposition: A | Discharge status: Home / Self Care | Payer: Self-pay | Source: Home / Self Care | Attending: Obstetrics and Gynecology

## 2021-06-09 ENCOUNTER — Inpatient Hospital Stay (HOSPITAL_COMMUNITY): Payer: BC Managed Care – PPO | Admitting: Anesthesiology

## 2021-06-09 ENCOUNTER — Inpatient Hospital Stay (HOSPITAL_COMMUNITY)
Admission: RE | Admit: 2021-06-09 | Discharge: 2021-06-12 | DRG: 788 | Disposition: A | Discharge status: Home / Self Care | Payer: BC Managed Care – PPO | Attending: Obstetrics and Gynecology | Admitting: Obstetrics and Gynecology

## 2021-06-09 ENCOUNTER — Encounter (HOSPITAL_COMMUNITY): Payer: Self-pay | Admitting: Obstetrics and Gynecology

## 2021-06-09 ENCOUNTER — Other Ambulatory Visit: Payer: Self-pay

## 2021-06-09 DIAGNOSIS — O3429 Maternal care due to uterine scar from other previous surgery: Secondary | ICD-10-CM | POA: Diagnosis present

## 2021-06-09 DIAGNOSIS — O34218 Maternal care for other type scar from previous cesarean delivery: Principal | ICD-10-CM

## 2021-06-09 DIAGNOSIS — O134 Gestational [pregnancy-induced] hypertension without significant proteinuria, complicating childbirth: Secondary | ICD-10-CM | POA: Diagnosis present

## 2021-06-09 DIAGNOSIS — O99824 Streptococcus B carrier state complicating childbirth: Secondary | ICD-10-CM | POA: Diagnosis present

## 2021-06-09 DIAGNOSIS — Z3A37 37 weeks gestation of pregnancy: Secondary | ICD-10-CM

## 2021-06-09 DIAGNOSIS — Z98891 History of uterine scar from previous surgery: Secondary | ICD-10-CM

## 2021-06-09 LAB — COMPREHENSIVE METABOLIC PANEL
ALT: 16 U/L (ref 0–44)
AST: 27 U/L (ref 15–41)
Albumin: 2.5 g/dL — ABNORMAL LOW (ref 3.5–5.0)
Alkaline Phosphatase: 135 U/L — ABNORMAL HIGH (ref 38–126)
Anion gap: 6 (ref 5–15)
BUN: 7 mg/dL (ref 6–20)
CO2: 24 mmol/L (ref 22–32)
Calcium: 8.9 mg/dL (ref 8.9–10.3)
Chloride: 108 mmol/L (ref 98–111)
Creatinine, Ser: 0.87 mg/dL (ref 0.44–1.00)
GFR, Estimated: >60 mL/min (ref >60)
Glucose, Bld: 90 mg/dL (ref 70–99)
Potassium: 4.2 mmol/L (ref 3.5–5.1)
Sodium: 138 mmol/L (ref 135–145)
Total Bilirubin: 0.5 mg/dL (ref 0.3–1.2)
Total Protein: 6.1 g/dL — ABNORMAL LOW (ref 6.5–8.1)

## 2021-06-09 LAB — PROTEIN / CREATININE RATIO, URINE
Creatinine, Urine: 84.22 mg/dL
Protein Creatinine Ratio: 0.63 mg/mg{creat} — ABNORMAL HIGH (ref 0.00–0.15)
Total Protein, Urine: 53 mg/dL

## 2021-06-09 SURGERY — Surgical Case
Anesthesia: Spinal

## 2021-06-09 MED ORDER — MORPHINE SULFATE (PF) 0.5 MG/ML IJ SOLN
INTRAMUSCULAR | Status: AC
  Filled 2021-06-09: qty 10

## 2021-06-09 MED ORDER — SENNOSIDES-DOCUSATE SODIUM 8.6-50 MG PO TABS
2.0000 | ORAL_TABLET | Freq: Every day | ORAL | Status: DC
  Administered 2021-06-10 – 2021-06-12 (×3): 2 via ORAL
  Filled 2021-06-09 (×3): qty 2

## 2021-06-09 MED ORDER — FENTANYL CITRATE (PF) 100 MCG/2ML IJ SOLN
INTRAMUSCULAR | Status: DC | PRN
  Administered 2021-06-09: 15 ug via INTRATHECAL

## 2021-06-09 MED ORDER — PHENYLEPHRINE HCL-NACL 20-0.9 MG/250ML-% IV SOLN
INTRAVENOUS | Status: AC
  Filled 2021-06-09: qty 250

## 2021-06-09 MED ORDER — CEFAZOLIN SODIUM-DEXTROSE 2-4 GM/100ML-% IV SOLN
INTRAVENOUS | Status: AC
  Filled 2021-06-09: qty 100

## 2021-06-09 MED ORDER — MENTHOL 3 MG MT LOZG: 1.0000 | LOZENGE | OROMUCOSAL | Status: DC | PRN

## 2021-06-09 MED ORDER — OXYCODONE HCL 5 MG PO TABS
5.0000 mg | ORAL_TABLET | ORAL | Status: DC | PRN
  Administered 2021-06-09 – 2021-06-10 (×2): 5 mg via ORAL
  Administered 2021-06-11: 10 mg via ORAL
  Filled 2021-06-09: qty 1
  Filled 2021-06-09: qty 2
  Filled 2021-06-09: qty 1

## 2021-06-09 MED ORDER — SOD CITRATE-CITRIC ACID 500-334 MG/5ML PO SOLN
ORAL | Status: AC
  Filled 2021-06-09: qty 30

## 2021-06-09 MED ORDER — SCOPOLAMINE 1 MG/3DAYS TD PT72
1.0000 | MEDICATED_PATCH | Freq: Once | TRANSDERMAL | Status: AC
  Administered 2021-06-09: 1.5 mg via TRANSDERMAL

## 2021-06-09 MED ORDER — SODIUM CHLORIDE 0.9 % IR SOLN
Status: DC | PRN
  Administered 2021-06-09: 1000 mL

## 2021-06-09 MED ORDER — KETOROLAC TROMETHAMINE 30 MG/ML IJ SOLN
INTRAMUSCULAR | Status: AC
  Filled 2021-06-09: qty 1

## 2021-06-09 MED ORDER — MORPHINE SULFATE (PF) 0.5 MG/ML IJ SOLN
INTRAMUSCULAR | Status: DC | PRN
  Administered 2021-06-09: 150 ug via EPIDURAL

## 2021-06-09 MED ORDER — SIMETHICONE 80 MG PO CHEW
80.0000 mg | CHEWABLE_TABLET | Freq: Three times a day (TID) | ORAL | Status: DC
  Administered 2021-06-09 – 2021-06-12 (×7): 80 mg via ORAL
  Filled 2021-06-09 (×8): qty 1

## 2021-06-09 MED ORDER — COCONUT OIL OIL: 1.0000 "application " | TOPICAL_OIL | Status: DC | PRN

## 2021-06-09 MED ORDER — WITCH HAZEL-GLYCERIN EX PADS: 1.0000 "application " | MEDICATED_PAD | CUTANEOUS | Status: DC | PRN

## 2021-06-09 MED ORDER — ACETAMINOPHEN 500 MG PO TABS
1000.0000 mg | ORAL_TABLET | ORAL | Status: AC
  Administered 2021-06-09: 1000 mg via ORAL

## 2021-06-09 MED ORDER — ONDANSETRON HCL 4 MG/2ML IJ SOLN
4.0000 mg | Freq: Three times a day (TID) | INTRAMUSCULAR | Status: DC | PRN
  Filled 2021-06-09: qty 2

## 2021-06-09 MED ORDER — DEXAMETHASONE SODIUM PHOSPHATE 10 MG/ML IJ SOLN
INTRAMUSCULAR | Status: DC | PRN
Start: 2021-06-09 — End: 2021-06-09
  Administered 2021-06-09: 10 mg via INTRAVENOUS

## 2021-06-09 MED ORDER — SIMETHICONE 80 MG PO CHEW
80.0000 mg | CHEWABLE_TABLET | ORAL | Status: DC | PRN
  Filled 2021-06-09: qty 1

## 2021-06-09 MED ORDER — FENTANYL CITRATE (PF) 100 MCG/2ML IJ SOLN: 25.0000 ug | INTRAMUSCULAR | Status: DC | PRN

## 2021-06-09 MED ORDER — DEXAMETHASONE SODIUM PHOSPHATE 10 MG/ML IJ SOLN
INTRAMUSCULAR | Status: AC
  Filled 2021-06-09: qty 1

## 2021-06-09 MED ORDER — ONDANSETRON HCL 4 MG/2ML IJ SOLN
INTRAMUSCULAR | Status: AC
  Filled 2021-06-09: qty 2

## 2021-06-09 MED ORDER — NALOXONE HCL 4 MG/10ML IJ SOLN
1.0000 ug/kg/h | INTRAVENOUS | Status: DC | PRN
  Filled 2021-06-09: qty 5

## 2021-06-09 MED ORDER — KETOROLAC TROMETHAMINE 30 MG/ML IJ SOLN
30.0000 mg | Freq: Four times a day (QID) | INTRAMUSCULAR | Status: DC | PRN
  Administered 2021-06-09: 30 mg via INTRAVENOUS

## 2021-06-09 MED ORDER — SCOPOLAMINE 1 MG/3DAYS TD PT72
MEDICATED_PATCH | TRANSDERMAL | Status: AC
  Filled 2021-06-09: qty 1

## 2021-06-09 MED ORDER — ACETAMINOPHEN 500 MG PO TABS: 1000.0000 mg | ORAL_TABLET | Freq: Four times a day (QID) | ORAL | Status: DC

## 2021-06-09 MED ORDER — DIPHENHYDRAMINE HCL 25 MG PO CAPS
25.0000 mg | ORAL_CAPSULE | Freq: Four times a day (QID) | ORAL | Status: DC | PRN
  Administered 2021-06-09 – 2021-06-10 (×2): 25 mg via ORAL
  Filled 2021-06-09 (×2): qty 1

## 2021-06-09 MED ORDER — OXYTOCIN-SODIUM CHLORIDE 30-0.9 UT/500ML-% IV SOLN
INTRAVENOUS | Status: AC
  Filled 2021-06-09: qty 500

## 2021-06-09 MED ORDER — PRENATAL MULTIVITAMIN CH
1.0000 | ORAL_TABLET | Freq: Every day | ORAL | Status: DC
  Administered 2021-06-10 – 2021-06-11 (×2): 1 via ORAL
  Filled 2021-06-09 (×2): qty 1

## 2021-06-09 MED ORDER — POVIDONE-IODINE 10 % EX SWAB
2.0000 "application " | Freq: Once | CUTANEOUS | Status: AC
  Administered 2021-06-09: 2 via TOPICAL

## 2021-06-09 MED ORDER — BUPIVACAINE IN DEXTROSE 0.75-8.25 % IT SOLN
INTRATHECAL | Status: AC
  Filled 2021-06-09: qty 2

## 2021-06-09 MED ORDER — DIBUCAINE (PERIANAL) 1 % EX OINT: 1.0000 "application " | TOPICAL_OINTMENT | CUTANEOUS | Status: DC | PRN

## 2021-06-09 MED ORDER — OXYTOCIN-SODIUM CHLORIDE 30-0.9 UT/500ML-% IV SOLN
INTRAVENOUS | Status: DC | PRN
  Administered 2021-06-09: 300 mL via INTRAVENOUS

## 2021-06-09 MED ORDER — IBUPROFEN 600 MG PO TABS
600.0000 mg | ORAL_TABLET | Freq: Four times a day (QID) | ORAL | Status: AC
  Administered 2021-06-09 – 2021-06-12 (×12): 600 mg via ORAL
  Filled 2021-06-09 (×12): qty 1

## 2021-06-09 MED ORDER — SOD CITRATE-CITRIC ACID 500-334 MG/5ML PO SOLN
30.0000 mL | ORAL | Status: AC
  Administered 2021-06-09: 30 mL via ORAL

## 2021-06-09 MED ORDER — FENTANYL CITRATE (PF) 100 MCG/2ML IJ SOLN
INTRAMUSCULAR | Status: AC
  Filled 2021-06-09: qty 2

## 2021-06-09 MED ORDER — ZOLPIDEM TARTRATE 5 MG PO TABS: 5.0000 mg | ORAL_TABLET | Freq: Every evening | ORAL | Status: DC | PRN

## 2021-06-09 MED ORDER — ONDANSETRON HCL 4 MG/2ML IJ SOLN
INTRAMUSCULAR | Status: DC | PRN
  Administered 2021-06-09: 4 mg via INTRAVENOUS

## 2021-06-09 MED ORDER — SODIUM CHLORIDE 0.9% FLUSH: 3.0000 mL | INTRAVENOUS | Status: DC | PRN

## 2021-06-09 MED ORDER — DIPHENHYDRAMINE HCL 25 MG PO CAPS: 25.0000 mg | ORAL_CAPSULE | ORAL | Status: DC | PRN

## 2021-06-09 MED ORDER — NALOXONE HCL 0.4 MG/ML IJ SOLN: 0.4000 mg | INTRAMUSCULAR | Status: DC | PRN

## 2021-06-09 MED ORDER — ACETAMINOPHEN 500 MG PO TABS
ORAL_TABLET | ORAL | Status: AC
  Filled 2021-06-09: qty 2

## 2021-06-09 MED ORDER — LACTATED RINGERS IV SOLN: INTRAVENOUS | Status: DC

## 2021-06-09 MED ORDER — PHENYLEPHRINE 80 MCG/ML (10ML) SYRINGE FOR IV PUSH (FOR BLOOD PRESSURE SUPPORT)
PREFILLED_SYRINGE | INTRAVENOUS | Status: AC
  Filled 2021-06-09: qty 10

## 2021-06-09 MED ORDER — ACETAMINOPHEN 500 MG PO TABS
1000.0000 mg | ORAL_TABLET | Freq: Four times a day (QID) | ORAL | Status: DC
  Administered 2021-06-09 – 2021-06-12 (×11): 1000 mg via ORAL
  Filled 2021-06-09 (×11): qty 2

## 2021-06-09 MED ORDER — CEFAZOLIN SODIUM-DEXTROSE 2-4 GM/100ML-% IV SOLN
2.0000 g | INTRAVENOUS | Status: AC
  Administered 2021-06-09: 2 g via INTRAVENOUS

## 2021-06-09 MED ORDER — KETOROLAC TROMETHAMINE 30 MG/ML IJ SOLN: 30.0000 mg | Freq: Four times a day (QID) | INTRAMUSCULAR | Status: DC | PRN

## 2021-06-09 MED ORDER — OXYTOCIN-SODIUM CHLORIDE 30-0.9 UT/500ML-% IV SOLN
2.5000 [IU]/h | INTRAVENOUS | Status: AC
  Administered 2021-06-09: 2.5 [IU]/h via INTRAVENOUS
  Filled 2021-06-09: qty 500

## 2021-06-09 MED ORDER — BUPIVACAINE IN DEXTROSE 0.75-8.25 % IT SOLN
INTRATHECAL | Status: DC | PRN
  Administered 2021-06-09: 1.8 mg via INTRATHECAL

## 2021-06-09 MED ORDER — TETANUS-DIPHTH-ACELL PERTUSSIS 5-2.5-18.5 LF-MCG/0.5 IM SUSY: 0.5000 mL | PREFILLED_SYRINGE | Freq: Once | INTRAMUSCULAR | Status: DC

## 2021-06-09 MED ORDER — DIPHENHYDRAMINE HCL 50 MG/ML IJ SOLN: 12.5000 mg | INTRAMUSCULAR | Status: DC | PRN

## 2021-06-09 MED ORDER — PHENYLEPHRINE 80 MCG/ML (10ML) SYRINGE FOR IV PUSH (FOR BLOOD PRESSURE SUPPORT)
PREFILLED_SYRINGE | INTRAVENOUS | Status: DC | PRN
  Administered 2021-06-09: 120 ug via INTRAVENOUS

## 2021-06-09 MED ORDER — PHENYLEPHRINE HCL-NACL 20-0.9 MG/250ML-% IV SOLN
INTRAVENOUS | Status: DC | PRN
  Administered 2021-06-09: 30 ug/min via INTRAVENOUS

## 2021-06-09 SURGICAL SUPPLY — 36 items
BENZOIN TINCTURE PRP APPL 2/3 (GAUZE/BANDAGES/DRESSINGS) ×1 IMPLANT
CHLORAPREP W/TINT 26ML (MISCELLANEOUS) ×3 IMPLANT
CLAMP CORD UMBIL (MISCELLANEOUS) ×1 IMPLANT
CLOTH BEACON ORANGE TIMEOUT ST (SAFETY) ×2 IMPLANT
DRAPE C SECTION CLR SCREEN (DRAPES) ×2 IMPLANT
DRSG OPSITE POSTOP 4X10 (GAUZE/BANDAGES/DRESSINGS) ×2 IMPLANT
ELECT REM PT RETURN 9FT ADLT (ELECTROSURGICAL) ×2
ELECTRODE REM PT RTRN 9FT ADLT (ELECTROSURGICAL) ×1 IMPLANT
EXTRACTOR VACUUM BELL CUP MITY (SUCTIONS) ×1 IMPLANT
GLOVE BIO SURGEON STRL SZ 6.5 (GLOVE) ×2 IMPLANT
GLOVE BIOGEL PI IND STRL 7.0 (GLOVE) ×2 IMPLANT
GLOVE BIOGEL PI INDICATOR 7.0 (GLOVE) ×2
GOWN STRL REUS W/TWL LRG LVL3 (GOWN DISPOSABLE) ×4 IMPLANT
KIT ABG SYR 3ML LUER SLIP (SYRINGE) IMPLANT
NDL HYPO 25X5/8 SAFETYGLIDE (NEEDLE) IMPLANT
NEEDLE HYPO 25X5/8 SAFETYGLIDE (NEEDLE) IMPLANT
NS IRRIG 1000ML POUR BTL (IV SOLUTION) ×2 IMPLANT
PACK C SECTION WH (CUSTOM PROCEDURE TRAY) ×2 IMPLANT
PAD OB MATERNITY 4.3X12.25 (PERSONAL CARE ITEMS) ×2 IMPLANT
RETRACTOR WND ALEXIS 25 LRG (MISCELLANEOUS) ×1 IMPLANT
RTRCTR C-SECT PINK 25CM LRG (MISCELLANEOUS) IMPLANT
RTRCTR WOUND ALEXIS 25CM LRG (MISCELLANEOUS) ×2
STRIP CLOSURE SKIN 1/2X4 (GAUZE/BANDAGES/DRESSINGS) ×1 IMPLANT
SUT CHROMIC 1 CTX 36 (SUTURE) ×4 IMPLANT
SUT PLAIN 0 NONE (SUTURE) IMPLANT
SUT PLAIN 2 0 XLH (SUTURE) ×2 IMPLANT
SUT VIC AB 0 CT1 27 (SUTURE) ×2
SUT VIC AB 0 CT1 27XBRD ANBCTR (SUTURE) ×2 IMPLANT
SUT VIC AB 2-0 CT1 27 (SUTURE) ×1
SUT VIC AB 2-0 CT1 TAPERPNT 27 (SUTURE) ×1 IMPLANT
SUT VIC AB 3-0 CT1 27 (SUTURE)
SUT VIC AB 3-0 CT1 TAPERPNT 27 (SUTURE) IMPLANT
SUT VIC AB 4-0 KS 27 (SUTURE) ×2 IMPLANT
TOWEL OR 17X24 6PK STRL BLUE (TOWEL DISPOSABLE) ×2 IMPLANT
TRAY FOLEY W/BAG SLVR 14FR LF (SET/KITS/TRAYS/PACK) ×2 IMPLANT
WATER STERILE IRR 1000ML POUR (IV SOLUTION) ×2 IMPLANT

## 2021-06-09 NOTE — Transfer of Care (Signed)
Immediate Anesthesia Transfer of Care Note ? ?Patient: Jill Herrera ? ?Procedure(s) Performed: CESAREAN SECTION ? ?Patient Location: PACU ? ?Anesthesia Type:Spinal ? ?Level of Consciousness: awake ? ?Airway & Oxygen Therapy: Patient Spontanous Breathing ? ?Post-op Assessment: Report given to RN ? ?Post vital signs: Reviewed and stable ? ?Last Vitals:  ?Vitals Value Taken Time  ?BP 127/80 06/09/21 0855  ?Temp    ?Pulse 74 06/09/21 0856  ?Resp 16 06/09/21 0856  ?SpO2 96 % 06/09/21 0856  ?Vitals shown include unvalidated device data. ? ?Last Pain:  ?Vitals:  ? 06/09/21 0555  ?TempSrc: Oral  ?PainSc: 0-No pain  ?   ? ?  ? ?Complications: No notable events documented. ?

## 2021-06-09 NOTE — Lactation Note (Signed)
This note was copied from a baby's chart. ?Lactation Consultation Note ? ?Patient Name: Jill Herrera ?Today's Date: 06/09/2021 ?Reason for consult: Initial assessment;Primapara;1st time breastfeeding;Early term 37-38.6wks;Breastfeeding assistance;Other (Comment) ?Age:33 hours ?As [name] 's entered the room , baby STS, and recently fed 10 mins.  ?LC checked the diaper and noted med wet.  ?After hand expressing spoon fed tiny drop of EBM, attempted the breast, baby sleepy. Baby STS with mom. Latch 4 .  ?Mom aware to call with feeding cues.  ? ?Maternal Data ?Has patient been taught Hand Expression?: Yes (small drops) ?Does the patient have breastfeeding experience prior to this delivery?: No ? ?Feeding ?Mother's Current Feeding Choice: Breast Milk and Formula ? ?LATCH Score ?Latch: Too sleepy or reluctant, no latch achieved, no sucking elicited. (baby is sleepy) ? ?Audible Swallowing: None ? ?Type of Nipple: Everted at rest and after stimulation ? ?Comfort (Breast/Nipple): Soft / non-tender ? ?Hold (Positioning): Full assist, staff holds infant at breast ? ?LATCH Score: 4 ? ? ?Lactation Tools Discussed/Used ?  ? ?Interventions ?Interventions: Breast massage;Hand express;Skin to skin;Breast feeding basics reviewed;Assisted with latch;Support pillows;Adjust position;Expressed milk;Education;LC Services brochure ? ?Discharge ?  ? ?Consult Status ?Consult Status: Follow-up ?Date: 06/09/21 ?Follow-up type: In-patient ? ? ? ?Jerlyn Ly [name] ?06/09/2021, 12:20 PM ? ? ? ?

## 2021-06-09 NOTE — Progress Notes (Signed)
Notified Dr Edilia Bo was called. I reported Pt's BP 154/100 and 156/100 during orthostatic VS, previous BP 139/92. Pt was given OxyIR '5mg'$  at 1335 for pain 4-5, relief of pain to score of 3. Pt had 300 ml emesis was given zofran. Labs reviewed and II requested one time toradol since ibuprofen is due now with Dr Rogue Bussing. No new orders are received. Dr Rogue Bussing said to continue to monitor BPs. ?

## 2021-06-09 NOTE — Anesthesia Procedure Notes (Signed)
Spinal ? ?Patient location during procedure: OR ?Start time: 06/09/2021 7:40 AM ?End time: 06/09/2021 7:44 AM ?Reason for block: surgical anesthesia ?Staffing ?Performed: anesthesiologist  ?Anesthesiologist: Freddrick March, MD ?Preanesthetic Checklist ?Completed: patient identified, IV checked, risks and benefits discussed, surgical consent, monitors and equipment checked, pre-op evaluation and timeout performed ?Spinal Block ?Patient position: sitting ?Prep: DuraPrep and site prepped and draped ?Patient monitoring: cardiac monitor, continuous pulse ox and blood pressure ?Approach: midline ?Location: L3-4 ?Injection technique: single-shot ?Needle ?Needle type: Pencan  ?Needle gauge: 24 G ?Needle length: 9 cm ?Assessment ?Sensory level: T6 ?Events: CSF return ?Additional Notes ?Functioning IV was confirmed and monitors were applied. Sterile prep and drape, including hand hygiene and sterile gloves were used. The patient was positioned and the spine was prepped. The skin was anesthetized with lidocaine.  Free flow of clear CSF was obtained prior to injecting local anesthetic into the CSF.  The spinal needle aspirated freely following injection.  The needle was carefully withdrawn.  The patient tolerated the procedure well.  ? ? ? ?

## 2021-06-09 NOTE — Anesthesia Postprocedure Evaluation (Signed)
Anesthesia Post Note ? ?Patient: Jill Herrera ? ?Procedure(s) Performed: CESAREAN SECTION ? ?  ? ?Patient location during evaluation: PACU ?Anesthesia Type: Spinal ?Level of consciousness: oriented and awake and alert ?Pain management: pain level controlled ?Vital Signs Assessment: post-procedure vital signs reviewed and stable ?Respiratory status: spontaneous breathing, respiratory function stable and patient connected to nasal cannula oxygen ?Cardiovascular status: blood pressure returned to baseline and stable ?Postop Assessment: no headache, no backache and no apparent nausea or vomiting ?Anesthetic complications: no ? ? ?No notable events documented. ? ?Last Vitals:  ?Vitals:  ? 06/09/21 0915 06/09/21 1024  ?BP: (!) 137/91 (!) 141/95  ?Pulse: 71 70  ?Resp: 18 16  ?Temp:  36.9 ?C  ?SpO2:  99%  ?  ?Last Pain:  ?Vitals:  ? 06/09/21 1024  ?TempSrc: Axillary  ?PainSc:   ? ?Pain Goal:   ? ?  ?  ?  ?  ?  ?  ?  ? ?Tyrice Hewitt L Antwane Grose ? ? ? ? ?

## 2021-06-09 NOTE — Interval H&P Note (Signed)
History and Physical Interval Note: ?Pt seen and examined. Reports no change since H/P done  ?Consent verified ?To OR when ready  ?06/09/2021 ?7:29 AM ? ?Jill Herrera  has presented today for surgery, with the diagnosis of history of myomectomy.  The various methods of treatment have been discussed with the patient and family. After consideration of risks, benefits and other options for treatment, the patient has consented to  Procedure(s) with comments: ?CESAREAN SECTION (N/A) - request 2nd scrub tech or RNFA as a surgical intervention.  The patient's history has been reviewed, patient examined, no change in status, stable for surgery.  I have reviewed the patient's chart and labs.  Questions were answered to the patient's satisfaction.   ? ? ?Jill Herrera ? ? ?

## 2021-06-09 NOTE — Procedures (Addendum)
Operative Note ? ? ? ?Preoperative Diagnosis ?IUP at 37 2/7wks ?History of myomectomy ? ? ?Postoperative Diagnosis: Same  ? ? ?Procedure: Vacuum assisted primary low transverse cesarean section with double layered closure  ? ? ?Surgeon: Mickle Mallory DO  ?Assist : Maida Sale RNFA ?Anesthesia: General: Dr Lanetta Inch MD/ Mable Fill CRNA  ? ?Fluids: LR 1827m ?EBL: 4756m?UOP: 7539m ? ?Findings: Viable female infant in vertex position Apgars 9,9, Wt 7lbs 8oz ?                 Grossly normal uterus, tubes and ovaries.  ? ? ?Specimen: Placenta to L/D ? ? ?Procedure Note  ?Patient was taken to the operating room where spinal anesthesia was administered. She was prepped and draped in the normal sterile fashion while in the dorsal supine position with a leftward tilt. An appropriate time out was performed.  ?A Pfannenstiel skin incision was then made with the scalpel and carried through to the underlying layer of fascia by sharp dissection and Bovie cautery. The fascia was nicked in the midline and the incision was extended laterally with Mayo scissors. The superior, then inferior, aspects of the incision were grasped with kocher clamps and dissected off the underlying rectus muscles.  Rectus muscles were separated in the midline and the peritoneal cavity entered bluntly.  ?The Alexis self-retaining wound retractor was then placed and the lower uterine segment exposed. The bladder flap was developed with Metzenbaum scissors and pushed away from the lower uterine segment. The lower uterine segment was then incised in a transverse fashion and the cavity itself entered bluntly. The infant's head was mobile in the lower uterine segment and required a bell vacuum to effect tilt to allow for delivery. One pull only, no pop off.  ?The head was then lifted and delivered from the incision without difficulty. The remainder of the infant delivered easily. The nose and mouth were bulb suctioned and the cord clamped and cut.  Cord blood was  obtained.  ?The infant was handed off to the waiting NICU team. The placenta was then spontaneously expressed and the uterus cleared of all clots and debris with moist lap sponge. The uterine incision was then repaired in 2 layers the first layer was a running locked layer 1-0 chromic and the second an imbricating layer of the same suture for hemostasis.  ?The tubes and ovaries were inspected and the gutters cleared of all clots and debris. The uterine incision was confirmed again o be hemostatic thus all instruments and sponges as well as the Alexis retractor were then removed from the abdomen.  ?Next, the rectus muscles and peritoneum were then reapproximated in a running fashion using 2-0 Vicryl suture. The fascia was then closed with 0 Vicryl in a running fashion. The skin was closed with a subcuticular stitch of 4-0 Vicryl on a Keith needle and then reinforced with benzoin and Steri-Strips.  ?At the conclusion of the procedure all instruments and sponge counts were correct. Patient was taken to the recovery room in good condition with her baby accompanying her skin to skin.   ?

## 2021-06-09 NOTE — Anesthesia Preprocedure Evaluation (Addendum)
Anesthesia Evaluation  ?Patient identified by MRN, date of birth, ID band ?Patient awake ? ? ? ?Reviewed: ?Allergy & Precautions, NPO status , Patient's Chart, lab work & pertinent test results ? ?Airway ?Mallampati: II ? ?TM Distance: >3 FB ?Neck ROM: Full ? ? ? Dental ? ?(+) Teeth Intact, Dental Advisory Given ?  ?Pulmonary ?neg pulmonary ROS,  ?  ?Pulmonary exam normal ?breath sounds clear to auscultation ? ? ? ? ? ? Cardiovascular ?negative cardio ROS ?Normal cardiovascular exam ?Rhythm:Regular Rate:Normal ? ? ?  ?Neuro/Psych ?negative neurological ROS ? negative psych ROS  ? GI/Hepatic ?negative GI ROS, Neg liver ROS,   ?Endo/Other  ?PCOS ? Renal/GU ?negative Renal ROS  ?negative genitourinary ?  ?Musculoskeletal ?negative musculoskeletal ROS ?(+)  ? Abdominal ?  ?Peds ? Hematology ? ?(+) Blood dyscrasia (Hgb 10.6), anemia ,   ?Anesthesia Other Findings ?Primary C/S for h/o myomectomy  ? Reproductive/Obstetrics ?(+) Pregnancy ? ?  ? ? ? ? ? ? ? ? ? ? ? ? ? ?  ?  ? ? ? ? ? ? ? ?Anesthesia Physical ?Anesthesia Plan ? ?ASA: 2 ? ?Anesthesia Plan: Spinal  ? ?Post-op Pain Management:   ? ?Induction:  ? ?PONV Risk Score and Plan: 2 and Treatment may vary due to age or medical condition ? ?Airway Management Planned: Natural Airway ? ?Additional Equipment:  ? ?Intra-op Plan:  ? ?Post-operative Plan:  ? ?Informed Consent: I have reviewed the patients History and Physical, chart, labs and discussed the procedure including the risks, benefits and alternatives for the proposed anesthesia with the patient or authorized representative who has indicated his/her understanding and acceptance.  ? ? ? ?Dental advisory given ? ?Plan Discussed with: CRNA ? ?Anesthesia Plan Comments:   ? ? ? ? ? ? ?Anesthesia Quick Evaluation ? ?

## 2021-06-09 NOTE — Lactation Note (Signed)
This note was copied from a baby's chart. ?Lactation Consultation Note ? ?Patient Name: Jill Herrera ?Today's Date: 06/09/2021 ?Reason for consult: Mother's request;Difficult latch;Primapara;1st time breastfeeding;Early term 37-38.6wks;Breastfeeding assistance;Other (Comment);Follow-up assessment ?Age:33 hours ? ?LC called for latch assistance. Infant fed 2 ml of colostrum on a spoon, then latched with signs of milk transfer. ?Infant still feeding at the end of the visit.  ? ?Maternal Data ?Has patient been taught Hand Expression?: Yes ? ?Feeding ?Mother's Current Feeding Choice: Breast Milk ? ?LATCH Score ?Latch: Repeated attempts needed to sustain latch, nipple held in mouth throughout feeding, stimulation needed to elicit sucking reflex. ? ?Audible Swallowing: Spontaneous and intermittent ? ?Type of Nipple: Everted at rest and after stimulation ? ?Comfort (Breast/Nipple): Soft / non-tender ? ?Hold (Positioning): Assistance needed to correctly position infant at breast and maintain latch. ? ?LATCH Score: 8 ? ? ?Lactation Tools Discussed/Used ?  ? ?Interventions ?Interventions: Breast feeding basics reviewed;Assisted with latch;Skin to skin;Breast massage;Hand express;Breast compression;Adjust position;Support pillows;Position options;Expressed milk;Education;Infant Driven Feeding Algorithm education ? ?Discharge ?Pump: Personal ? ?Consult Status ?Consult Status: Follow-up ?Date: 06/10/21 ?Follow-up type: In-patient ? ? ? ?Latorria Zeoli  Nicholson-Springer ?06/09/2021, 4:58 PM ? ? ? ?

## 2021-06-10 ENCOUNTER — Encounter (HOSPITAL_COMMUNITY): Payer: Self-pay | Admitting: Obstetrics and Gynecology

## 2021-06-10 LAB — CBC
HCT: 25.6 % — ABNORMAL LOW (ref 36.0–46.0)
Hemoglobin: 8.7 g/dL — ABNORMAL LOW (ref 12.0–15.0)
MCH: 24.4 pg — ABNORMAL LOW (ref 26.0–34.0)
MCHC: 34 g/dL (ref 30.0–36.0)
MCV: 71.9 fL — ABNORMAL LOW (ref 80.0–100.0)
Platelets: 266 10*3/uL (ref 150–400)
RBC: 3.56 MIL/uL — ABNORMAL LOW (ref 3.87–5.11)
RDW: 14.9 % (ref 11.5–15.5)
WBC: 9.5 10*3/uL (ref 4.0–10.5)
nRBC: 0.4 % — ABNORMAL HIGH (ref 0.0–0.2)

## 2021-06-10 LAB — BIRTH TISSUE RECOVERY COLLECTION (PLACENTA DONATION)

## 2021-06-10 NOTE — Lactation Note (Signed)
This note was copied from a baby's chart. ?Lactation Consultation Note ?[place] paged to room to assist with latch. Placed baby in football hold on R and he latched easily. Reviewed cues and expectations on night 2. Mother is aware of [place] services prn.  ? ?Patient Name: Jill Herrera ?Today's Date: 06/10/2021 ?Reason for consult: Mother's request ?Age:33 hours ? ?LATCH Score ?Latch: Grasps breast easily, tongue down, lips flanged, rhythmical sucking. ? ?Audible Swallowing: Spontaneous and intermittent ? ?Type of Nipple: Everted at rest and after stimulation ? ?Comfort (Breast/Nipple): Soft / non-tender ? ?Hold (Positioning): Assistance needed to correctly position infant at breast and maintain latch. ? ?LATCH Score: 9 ? ?Consult Status ?Consult Status: Follow-up ?Follow-up type: In-patient ? ? ?Gwynne Edinger ?06/10/2021, 6:42 PM ? ? ? ?

## 2021-06-10 NOTE — Progress Notes (Signed)
Patient is doing well.  She had some vomiting overnight, but is now ambulating and tolerating liquids.  Is about to order breakfast as nausea has resolved.  Foley catheter just removed 2 hours ago--has not yet voided.  Pain is controlled.  Lochia is appropriate ? ?Vitals:  ? 06/09/21 1813 06/09/21 2129 06/10/21 0136 06/10/21 3212  ?BP: 137/87 130/71 129/89 121/83  ?Pulse: 65 (!) 59 61 (!) 58  ?Resp: '17 18 16 16  '$ ?Temp: 97.9 ?F (36.6 ?C) 98.4 ?F (36.9 ?C) 98.3 ?F (36.8 ?C) 98.3 ?F (36.8 ?C)  ?TempSrc: Oral Oral Oral Oral  ?SpO2:  99% 100% 99%  ?Weight:      ?Height:      ? ? ?NAD ?Abdomen:  soft, appropriate tenderness, incisions intact and without erythema or drainage ?ext:    Symmetric, 1+ edema bilaterally ? ?Lab Results  ?Component Value Date  ? WBC 9.5 06/10/2021  ? HGB 8.7 (L) 06/10/2021  ? HCT 25.6 (L) 06/10/2021  ? MCV 71.9 (L) 06/10/2021  ? PLT 266 06/10/2021  ? ? ?--/--/O POS (04/18 0915) ? ?A/P    31 y.o. G1P1001 POD #1 s/p primary cesarean section at 37 weeks for history of myomectomy ?Routine post op and postpartum care.   ? ?Desires circumcision.   Discussed r/b/a of the procedure.  Reviewed that circumcision is an elective surgical procedure and not considered medically necessary.  Reviewed the risks of the procedure including the risk of infection, bleeding, damage to surrounding structures, including scrotum, shaft, urethra and head of penis, and an undesired cosmetic effect requiring additional procedures for revision.  Consent signed.  ? ? ?

## 2021-06-11 MED ORDER — OXYCODONE-ACETAMINOPHEN 5-325 MG PO TABS: 1.0000 | ORAL_TABLET | ORAL | 0 refills | Status: AC | PRN

## 2021-06-11 MED ORDER — IBUPROFEN 600 MG PO TABS: 600.0000 mg | ORAL_TABLET | Freq: Four times a day (QID) | ORAL | 1 refills | Status: DC | PRN

## 2021-06-11 NOTE — Progress Notes (Addendum)
Subjective: ?Postpartum Day 2: Cesarean Delivery ?Patient reports tolerating PO, + flatus, and no problems voiding.  N/V improved. Denies HA, CP, SOB. She is bonding well with baby - breastfeeding. Pain well controlled with meds  ? ?Objective: ?Vital signs in last 24 hours: ?Temp:  [98.2 ?F (36.8 ?C)-98.4 ?F (36.9 ?C)] 98.2 ?F (36.8 ?C) (04/21 2215) ?Pulse Rate:  [71-84] 84 (04/21 2215) ?Resp:  [16] 16 (04/21 2215) ?BP: (113-127)/(79-82) 127/82 (04/21 2215) ?SpO2:  [98 %-100 %] 98 % (04/21 2215) ? ?Physical Exam:  ?General: alert, cooperative, and no distress ?Lochia: appropriate ?Uterine Fundus: firm ?Incision: no significant drainage ?DVT Evaluation: No evidence of DVT seen on physical exam. ? ?Recent Labs  ?  06/10/21 ?6270  ?HGB 8.7*  ?HCT 25.6*  ? ? ?Assessment/Plan: ?Status post Cesarean section. Doing well postoperatively.  ?Discharge home with standard precautions and return to clinic in 1 week for BP and incision check and 6 weeks for pp visit  ? ?Jill Herrera ?06/11/2021, 10:55 AM ? ? ?

## 2021-06-11 NOTE — Progress Notes (Signed)
Patient ID: Jill Herrera, female   DOB: 1990-03-14, 31 y.o.   MRN: [id number] ?Pt had a bout of severe dizziness post shower ?Notified by nurse ? ?On exam, pt reports improved symptoms, laying in bed. No HA or SOB ? ?Plan to check orthostatic vitals  ? ?Hold discharge till tomorrow  ?

## 2021-06-11 NOTE — Discharge Summary (Addendum)
? ?  Postpartum Discharge Summary ? ?Date of Service updated  ? ?   ?Patient Name: Jill Herrera ?DOB: 07-02-1990 ?MRN: [id number] ? ?Date of admission: 06/09/2021 ?Delivery date:06/09/2021  ?Delivering provider: [facility]  ?Date of discharge: 06/12/2021 ? ?Admitting diagnosis: Maternal care due to uterine scar from other previous surgery [O34.29] ?Postpartum care following cesarean delivery [Z39.2] ?Intrauterine pregnancy: [redacted]w[redacted]d    ?Secondary diagnosis:  Principal Problem: ?  Maternal care due to uterine scar from other previous surgery ?Active Problems: ?  Status post primary low transverse cesarean section ?  Postpartum care following cesarean delivery ? ?Additional problems: ghtn    ?Discharge diagnosis: Term Pregnancy Delivered and Gestational Hypertension                                              ?Post partum procedures: n/a ?Augmentation: N/A ?Complications: None ? ?Hospital course: Sceduled C/S   31y.o. yo G1P1001 at 367w2das admitted to the hospital 06/09/2021 for scheduled cesarean section with the following indication:Prior Uterine Surgery.Delivery details are as follows:  ?Membrane Rupture Time/Date: 8:11 AM ,06/09/2021   ?Delivery Method:C-Section, Vacuum Assisted  ?Details of operation can be found in separate operative note.  Patient had an uncomplicated postpartum course.  She is ambulating, tolerating a regular diet, passing flatus, and urinating well. Patient is discharged home in stable condition on  06/12/21 ?       ?Newborn Data: ?Birth date:06/09/20[birth year]  ?Birth time:8:13 AM  ?Gender:Female  ?Living status:Living  ?Apgars:9 ,9  ?Weight:3390 g    ? ?Magnesium Sulfate received: No ?BMZ received: No ? ?Physical exam  ?Vitals:  ? 06/11/21 2003 06/11/21 2100 06/12/21 0049 06/12/21 0447  ?BP: (!) 136/99 (!) 129/92 (!) 137/95 130/87  ?Pulse: 84 88 84 80  ?Resp: 20     ?Temp: 98.6 ?F (37 ?C)   98.5 ?F (36.9 ?C)  ?TempSrc: Oral   Oral  ?SpO2: 100% 99% 96% 100%  ?Weight:      ?Height:       ? ?General: alert, cooperative, and no distress ?Lochia: mild ?Uterine Fundus: firm ?Incision: Dressing is clean, dry, and intact ?DVT Evaluation: No evidence of DVT seen on physical exam. ?Labs: ?Lab Results  ?Component Value Date  ? WBC 9.5 06/10/2021  ? HGB 8.7 (L) 06/10/2021  ? HCT 25.6 (L) 06/10/2021  ? MCV 71.9 (L) 06/10/2021  ? PLT 266 06/10/2021  ? ? ?  Latest Ref Rng & Units 06/09/2021  ? 11:58 AM  ?CMP  ?Glucose 70 - 99 mg/dL 90    ?BUN 6 - 20 mg/dL 7    ?Creatinine 0.44 - 1.00 mg/dL 0.87    ?Sodium 135 - 145 mmol/L 138    ?Potassium 3.5 - 5.1 mmol/L 4.2    ?Chloride 98 - 111 mmol/L 108    ?CO2 22 - 32 mmol/L 24    ?Calcium 8.9 - 10.3 mg/dL 8.9    ?Total Protein 6.5 - 8.1 g/dL 6.1    ?Total Bilirubin 0.3 - 1.2 mg/dL 0.5    ?Alkaline Phos 38 - 126 U/L 135    ?AST 15 - 41 U/L 27    ?ALT 0 - 44 U/L 16    ? ?Edinburgh Score: ? ?  06/11/2021  ?  3:25 PM  ?Edinburgh Postnatal Depression Scale Screening Tool  ?I have been able to laugh and see  the funny side of things. 0  ?I have looked forward with enjoyment to things. 0  ?I have blamed myself unnecessarily when things went wrong. 1  ?I have been anxious or worried for no good reason. 2  ?I have felt scared or panicky for no good reason. 1  ?Things have been getting on top of me. 1  ?I have been so unhappy that I have had difficulty sleeping. 0  ?I have felt sad or miserable. 0  ?I have been so unhappy that I have been crying. 0  ?The thought of harming myself has occurred to me. 0  ?Edinburgh Postnatal Depression Scale Total 5  ? ? ? ? ?After visit meds:  ?Allergies as of 06/12/2021   ? ?   Reactions  ? Other   ? Raw Spinach - makes tongue tingle   ? ?  ? ?  ?Medication List  ?  ? ?TAKE these medications   ? ?albuterol 108 (90 Base) MCG/ACT inhaler ?Commonly known as: ProAir HFA ?Inhale 1-2 puffs into the lungs every 4 (four) hours as needed for wheezing or shortness of breath. ?  ?doxylamine (Sleep) 25 MG tablet ?Commonly known as: UNISOM ?Take 25 mg by mouth at  bedtime as needed for sleep (Every other night). ?  ?ibuprofen 600 MG tablet ?Commonly known as: ADVIL ?Take 1 tablet (600 mg total) by mouth every 6 (six) hours as needed for moderate pain or cramping. ?  ?NIFEdipine 30 MG 24 hr tablet ?Commonly known as: Procardia XL ?Take 1 tablet (30 mg total) by mouth daily. ?  ?oxyCODONE-acetaminophen 5-325 MG tablet ?Commonly known as: Percocet ?Take 1 tablet by mouth every 4 (four) hours as needed for up to 7 days for severe pain. ?  ?PRENATAL VITAMIN PO ?Take 1 tablet by mouth daily. ?  ? ?  ? ?  ?  ? ? ?  ?Discharge Care Instructions  ?(From admission, onward)  ?  ? ? ?  ? ?  Start     Ordered  ? 06/12/21 0000  Discharge wound care:       ?Comments: May shower, pat dry. Keep clean  ? 06/12/21 0754  ? ?  ?  ? ?  ? ? ? ?Discharge home in stable condition ?Infant Feeding: Breast ?Infant Disposition:home with mother ?Discharge instruction: per After Visit Summary and Postpartum booklet. ?Activity: Advance as tolerated. Pelvic rest for 6 weeks.  ?Diet: low salt diet ?Anticipated Birth Control: Unsure ?Postpartum Appointment:6 weeks ?Additional Postpartum F/U: Postpartum Depression checkup, Incision check 1 week, and BP check 1 week ?Future Appointments:No future appointments. ?Follow up Visit: ? Follow-up Information   ? ? [facility]. Schedule an appointment as soon as possible for a visit.   ?Why: 1 week for BP and incision check and 6 weeks for postpartum visit ?Contact information: ?[address]  ?[address] ?[place] [address] ?[phone] ? ? ?  ?  ? ?  ?  ? ?  ? ? ? ?  ? ?06/12/2021 ?Isaiah Serge, DO ? ? ?

## 2021-06-11 NOTE — Progress Notes (Signed)
Orthostatic VS done at 1523 today with elevated DBP (ranged from 88 to 94); SBP's ranged from 127 to 133.  HR ranged from 78 to 85.  Pt asymptomatic.  After eating and resting, a repeat BP was checked at 1650, which was improved at 135/83. Dr. Terri Piedra notified. ? ?Will continue to monitor. ?

## 2021-06-11 NOTE — Lactation Note (Addendum)
This note was copied from a baby's chart. ?Lactation Consultation Note ? ?Patient Name: Jill Herrera ?Today's Date: 06/11/2021 ?Reason for consult: Follow-up assessment;Difficult latch;Early term 37-38.6wks;Infant weight loss;Primapara;1st time breastfeeding AMA ?Age:31 years ? ?LC in to visit with P4 Mom of ET infant.  Baby at 9.1% weight loss with adequate output.   ? ?Mom seated on couch holding baby in cradle hold.  Baby's mouth on Mom's nipple and baby sucking in with his cheeks with shallow latch.LC offered to help with a more comfortable position.  Pillow support placed under baby and under Mom's elbows.  Baby assisted to latch in [place] hold several times without being able to attain.  Tried in football hold also on left breast.  Baby tucking in his lips and sucking in on lips. ? ?LC initiated a 24 mm nipple shield, showing Mom how to properly apply to nipple.  Nipple pulled well into shield..  Baby able to attain depth on the breast and lips flanged onto areola.  Baby sucking with deep jaw extensions.  No swallows identified.  Taught Mom to use alternate breast compression. ? ?After 10 mins, baby fell asleep.  No colostrum noted in shield. ? ?Baby swaddled in crib, he became fussy.  LC concerned about baby's ability to transfer at the breast. ? ?Assisted Mom to double pump on initiation setting.  Milk (transitional) starting to flow.  Mom taught how to clean equipment, 2 bins provided. ? ?Talked about the importance of offering baby her EBM after each breastfeeding.  ? ?Plan recommended- ?1- STS with baby as much as possible ?2- Offer the breast with cues, or awaken baby for feedings at 3 hrs (due to weight loss) ?3- Using nipple shield to help baby latch deeper, offer the breast with cues. ?4- Pump both breasts on initiation setting for 15 mins ?5- Supplement baby with EBM 10-20 ml or more as tolerated.  Mom to use cup, syringe, slow flow bottle. ? ? ?Mom desires OP lactation F/U.  Message  sent to clinic. ?RN aware of plan. ? ? ?LATCH Score ?Latch: Grasps breast easily, tongue down, lips flanged, rhythmical sucking. ? ?Audible Swallowing: A few with stimulation ? ?Type of Nipple: Everted at rest and after stimulation ? ?Comfort (Breast/Nipple): Soft / non-tender ? ?Hold (Positioning): Assistance needed to correctly position infant at breast and maintain latch. ? ?LATCH Score: 8 ? ? ?Lactation Tools Discussed/Used ?Tools: Nipple Shields;Pump;Flanges ?Nipple shield size: 24 ?Flange Size: 24 ?Breast pump type: Double-Electric Breast Pump ?Pump Education: Setup, frequency, and cleaning;Milk Storage ?Reason for Pumping: Support milk supply/diffcult latch/use of nipple shield ?Pumping frequency: Encouraged Mom to pump after breastfeeding ?Pumped volume: 20 mL ? ?Discharge ?Discharge Education: Outpatient recommendation;Outpatient Epic message sent ?Pump: Personal (Spectra DEBP) ?[place] Program: No ? ?Consult Status ?Consult Status: Follow-up ?Date: 06/12/21 ?Follow-up type: In-patient ? ? ? ?Tilda Burrow E ?06/11/2021, 1:45 PM ? ? ? ?

## 2021-06-11 NOTE — Discharge Instructions (Signed)
Call office with any concerns [phone] 

## 2021-06-11 NOTE — Progress Notes (Signed)
Patient ID: Jill Herrera, female   DOB: [birth year], 31 y.o.   MRN: [id number] ?Chart check. Updated with results of orthostatic vitals.  ?No intervention indicated at this time ? ?Continue to monitor  ?

## 2021-06-12 MED ORDER — NIFEDIPINE ER OSMOTIC RELEASE 30 MG PO TB24
30.0000 mg | ORAL_TABLET | Freq: Once | ORAL | Status: AC
  Administered 2021-06-12: 30 mg via ORAL
  Filled 2021-06-12: qty 1

## 2021-06-12 MED ORDER — NIFEDIPINE ER OSMOTIC RELEASE 30 MG PO TB24: 30.0000 mg | ORAL_TABLET | Freq: Every day | ORAL | 1 refills | Status: DC

## 2021-06-12 NOTE — Lactation Note (Signed)
This note was copied from a baby's chart. ?Lactation Consultation Note ? ?Patient Name: Jill Herrera ?Today's Date: 06/12/2021 ?Reason for consult: Follow-up assessment ?Age:33 hours ? ?[redacted]w[redacted]d Mother states baby is now sustaining latch ?Mother is pumping 35 ml.   ?Encouraged mother to pump 4-5 times per day and as infant's weight increases, she can start dropping pumping sessions. ?Feed on demand with cues.  Goal 8-12+ times per day after first 24 hrs.  Place baby STS if not cueing.  ?Supplement after with breastmilk 30 ml + using paced feeding. ?Reviewed engorgement care and monitoring voids/stools. ? ? ?Feeding ?Mother's Current Feeding Choice: Breast Milk ? ?Interventions ?Interventions: Breast feeding basics reviewed;DEBP;Education;Pace feeding ? ?Discharge ?Discharge Education: Engorgement and breast care;Warning signs for feeding baby ?Pump: DEBP;Personal (Spectra) ? ?Consult Status ?Consult Status: Complete ?Date: 06/13/21 ? ? ? ?BVivianne MasterBoschen ?06/12/2021, 10:08 AM ? ? ? ?

## 2021-06-12 NOTE — Progress Notes (Signed)
Subjective: ?Postpartum Day 3: Cesarean Delivery ?Patient reports tolerating PO, + flatus, and no problems voiding.  No more dizziness. Bonding well - breast/bottlefeeding. Denies HA, blurry vision, CP or SOB. Started on procardia last night - tolerated well. Ready for discharge home today  ? ?Objective: ?Vital signs in last 24 hours: ?Temp:  [98.5 ?F (36.9 ?C)-99.7 ?F (37.6 ?C)] 98.5 ?F (36.9 ?C) (04/23 0447) ?Pulse Rate:  [80-88] 80 (04/23 0447) ?Resp:  [20] 20 (04/22 2003) ?BP: (129-137)/(83-99) 130/87 (04/23 0447) ?SpO2:  [96 %-100 %] 100 % (04/23 0447) ? ?Physical Exam:  ?General: alert, cooperative, and no distress ?Lochia: appropriate ?Uterine Fundus: firm ?Incision: no significant drainage ?DVT Evaluation: No evidence of DVT seen on physical exam. ?Negative Homan's sign. ? ?Recent Labs  ?  06/10/21 ?1165  ?HGB 8.7*  ?HCT 25.6*  ? ? ?Assessment/Plan: ?Status post Cesarean section. Doing well postoperatively.  ?Discharge home with standard precautions and return to clinic in 1 week for BP and incision check and 6 weeks for postpartum visit  ? ?Jill Herrera ?06/12/2021, 7:49 AM ? ? ?

## 2021-06-13 ENCOUNTER — Encounter (HOSPITAL_COMMUNITY): Payer: Self-pay | Admitting: Obstetrics and Gynecology

## 2021-06-18 ENCOUNTER — Telehealth (HOSPITAL_COMMUNITY): Payer: Self-pay

## 2021-06-18 NOTE — Telephone Encounter (Signed)
No answer. Left message to return nurse call. ? Jerald Kief ?06/18/2021,1157 ?

## 2021-11-14 ENCOUNTER — Encounter: Payer: Self-pay | Admitting: *Deleted

## 2022-02-02 ENCOUNTER — Encounter: Payer: Self-pay | Admitting: *Deleted

## 2023-03-04 ENCOUNTER — Encounter (HOSPITAL_BASED_OUTPATIENT_CLINIC_OR_DEPARTMENT_OTHER): Payer: Self-pay | Admitting: Emergency Medicine

## 2023-03-04 ENCOUNTER — Ambulatory Visit (HOSPITAL_BASED_OUTPATIENT_CLINIC_OR_DEPARTMENT_OTHER)
Admission: EM | Admit: 2023-03-04 | Discharge: 2023-03-04 | Disposition: A | Discharge status: Home / Self Care | Payer: BC Managed Care – PPO

## 2023-03-04 DIAGNOSIS — R42 Dizziness and giddiness: Secondary | ICD-10-CM

## 2023-03-04 DIAGNOSIS — N926 Irregular menstruation, unspecified: Secondary | ICD-10-CM

## 2023-03-04 DIAGNOSIS — R051 Acute cough: Secondary | ICD-10-CM

## 2023-03-04 DIAGNOSIS — D649 Anemia, unspecified: Secondary | ICD-10-CM

## 2023-03-04 LAB — POCT URINALYSIS DIP (MANUAL ENTRY)
Bilirubin, UA: NEGATIVE
Glucose, UA: NEGATIVE mg/dL
Ketones, POC UA: NEGATIVE mg/dL
Leukocytes, UA: NEGATIVE
Nitrite, UA: NEGATIVE
Protein Ur, POC: NEGATIVE mg/dL
Spec Grav, UA: 1.025
Urobilinogen, UA: 0.2 U/dL
pH, UA: 5.5

## 2023-03-04 LAB — POC COVID19/FLU A&B COMBO
Covid Antigen, POC: NEGATIVE
Influenza A Antigen, POC: NEGATIVE
Influenza B Antigen, POC: NEGATIVE

## 2023-03-04 LAB — POCT URINE PREGNANCY: Preg Test, Ur: NEGATIVE

## 2023-03-04 MED ORDER — FERROUS SULFATE 325 (65 FE) MG PO TABS: 325.0000 mg | ORAL_TABLET | Freq: Every day | ORAL | 0 refills | Status: AC

## 2023-03-04 NOTE — Discharge Instructions (Addendum)
 COVID and flu testing were negative.  Orthostatic blood pressure and pulse readings were normal.  Patient had a hemoglobin of 9.6 with her GYN physical in the last month.  She has moderate anemia.  Encouraged good fluid intake and hydration.  Started on iron  supplement, ferrous sulfate , 325 mg, daily.  Follow-up with family doctor or GYN regarding anemia.  Return here if symptoms do not improve, worsen, or new symptoms occur.

## 2023-03-04 NOTE — ED Triage Notes (Signed)
 Pt c/o feeling dizzy started Friday the day before she took a benadryl, yesterday felt better then this afternoon she had a dizzy spell and had a headache. Pt seen OB doctor Wednesday and her hemoglobin was 9.6 .

## 2023-03-04 NOTE — ED Provider Notes (Signed)
 [facility]    CSN: [id number] Arrival date & time: 03/04/23  1549      History   Chief Complaint No chief complaint on file.   HPI Jill Herrera is a 33 y.o. female.   Here with her husband.  On Thursday night patient took some Benadryl  and Friday morning she woke up at 4 AM she has to be at work by 5 AM.  At work she was dizzy and lightheaded.  And she felt bad all day she stated work that she did not feel good.  She reports that she was recently seen for an annual physical with her GYN and told that her hemoglobin was 9.6.  She has a 33-year-old and is not currently on birth control.  She reports that her menstrual cycles have been irregular and she has not had a consistent menstrual cycle.  On Saturday she had multiple times when she was dizzy anytime she stood up.  And it seemed to be as bad or worse today.  She is weak and dizzy at times.  She has had a mild cough.     Past Medical History:  Diagnosis Date   Anemia    Fibroid of cervix     Patient Active Problem List   Diagnosis Date Noted   Status post primary low transverse cesarean section 06/09/2021   Maternal care due to uterine scar from other previous surgery 06/09/2021   Postpartum care following cesarean delivery 06/09/2021   PCOS (polycystic ovarian syndrome) 02/05/2015    Past Surgical History:  Procedure Laterality Date   CESAREAN SECTION N/A 06/09/2021   Procedure: CESAREAN SECTION;  Surgeon: Delana Ted Morrison, DO;  Location: [facility] LD ORS;  Service: Obstetrics;  Laterality: N/A;  request 2nd scrub tech or RNFA   ROBOT ASSISTED MYOMECTOMY N/A 06/2011   UTERINE FIBROID SURGERY      OB History     Gravida  1   Para  1   Term  1   Preterm  0   AB  0   Living  1      SAB  0   IAB  0   Ectopic  0   Multiple  0   Live Births  1            Home Medications    Prior to Admission medications   Medication Sig Start Date End Date Taking? Authorizing Provider   cephALEXin (KEFLEX) 500 MG capsule Take 1 capsule every 6 hours by oral route as directed for 5 days. 02/28/23  Yes [provider]  ferrous sulfate  325 (65 FE) MG tablet Take 1 tablet (325 mg total) by mouth daily. 03/04/23 04/03/23 Yes Ival Domino, FNP  ibuprofen  (ADVIL ) 800 MG tablet Take 1 tablet every 8 hours by oral route for 5 days. 01/23/23  Yes [provider]  albuterol  (PROAIR  HFA) 108 (90 Base) MCG/ACT inhaler Inhale 1-2 puffs into the lungs every 4 (four) hours as needed for wheezing or shortness of breath. 05/10/18   Waddell Rake, MD  doxylamine, Sleep, (UNISOM) 25 MG tablet Take 25 mg by mouth at bedtime as needed for sleep (Every other night).    [provider]  ibuprofen  (ADVIL ) 600 MG tablet Take 1 tablet (600 mg total) by mouth every 6 (six) hours as needed for moderate pain or cramping. 06/11/21   Banga, Ted Morrison, DO  NIFEdipine  (PROCARDIA  XL) 30 MG 24 hr tablet Take 1 tablet (30 mg total) by mouth daily.  06/12/21   Banga, Ted Morrison, DO  Prenatal Vit-Fe Fumarate-FA (PRENATAL VITAMIN PO) Take 1 tablet by mouth daily.    [provider]    Family History Family History  Problem Relation Age of Onset   Asthma Mother    Diabetes Mother    Miscarriages / Stillbirths Mother     Social History Social History   Tobacco Use   Smoking status: Never   Smokeless tobacco: Never  Vaping Use   Vaping status: Never Used  Substance Use Topics   Alcohol use: Yes    Alcohol/week: 1.0 standard drink of alcohol    Types: 1 Glasses of wine per week    Comment: OCC GLASS OF WINE   Drug use: No     Allergies   Other   Review of Systems Review of Systems  Constitutional:  Positive for fatigue. Negative for chills and fever.  HENT:  Negative for congestion, ear pain, postnasal drip, rhinorrhea, sinus pressure, sinus pain and sore throat.   Eyes:  Negative for pain and visual disturbance.  Respiratory:  Positive for cough. Negative  for shortness of breath.   Cardiovascular:  Negative for chest pain and palpitations.  Gastrointestinal:  Negative for abdominal pain and vomiting.  Genitourinary:  Negative for dysuria and hematuria.  Musculoskeletal:  Negative for arthralgias and back pain.  Skin:  Negative for color change and rash.  Neurological:  Positive for dizziness, syncope, weakness and light-headedness. Negative for seizures.  All other systems reviewed and are negative.    Physical Exam Triage Vital Signs ED Triage Vitals  Encounter Vitals Group     BP 03/04/23 1613 139/84     Systolic BP Percentile --      Diastolic BP Percentile --      Pulse Rate 03/04/23 1613 70     Resp 03/04/23 1613 18     Temp 03/04/23 1613 98.2 F (36.8 C)     Temp Source 03/04/23 1613 Oral     SpO2 03/04/23 1613 97 %     Weight --      Height --      Head Circumference --      Peak Flow --      Pain Score 03/04/23 1611 0     Pain Loc --      Pain Education --      Exclude from Growth Chart --    Orthostatic VS for the past 24 hrs:  BP- Lying Pulse- Lying BP- Sitting Pulse- Sitting BP- Standing at 0 minutes Pulse- Standing at 0 minutes  03/04/23 1637 126/80 72 130/84 76 (!) 137/94 83    Updated Vital Signs BP 139/84 (BP Location: Right Arm)   Pulse 70   Temp 98.2 F (36.8 C) (Oral)   Resp 18   LMP 02/09/2023 (Exact Date)   SpO2 97%   Visual Acuity Right Eye Distance:   Left Eye Distance:   Bilateral Distance:    Right Eye Near:   Left Eye Near:    Bilateral Near:     Physical Exam Vitals and nursing note reviewed.  Constitutional:      General: She is not in acute distress.    Appearance: She is well-developed.  HENT:     Head: Normocephalic and atraumatic.     Right Ear: Hearing, tympanic membrane, ear canal and external ear normal.     Left Ear: Hearing, tympanic membrane, ear canal and external ear normal.     Nose: Nose normal.  Mouth/Throat:     Lips: Pink.     Mouth: Mucous membranes are  moist.     Pharynx: Uvula midline. No oropharyngeal exudate or posterior oropharyngeal erythema.     Tonsils: No tonsillar exudate.  Eyes:     Conjunctiva/sclera: Conjunctivae normal.     Pupils: Pupils are equal, round, and reactive to light.  Cardiovascular:     Rate and Rhythm: Normal rate and regular rhythm.     Heart sounds: Normal heart sounds, S1 normal and S2 normal. No murmur heard. Pulmonary:     Effort: Pulmonary effort is normal. No respiratory distress.     Breath sounds: Normal breath sounds. No decreased breath sounds, wheezing, rhonchi or rales.  Abdominal:     Palpations: Abdomen is soft.     Tenderness: There is no abdominal tenderness.  Musculoskeletal:        General: No swelling.     Cervical back: Neck supple.  Lymphadenopathy:     Head:     Right side of head: No submental, submandibular, tonsillar, preauricular or posterior auricular adenopathy.     Left side of head: No submental, submandibular, tonsillar, preauricular or posterior auricular adenopathy.     Cervical: No cervical adenopathy.     Right cervical: No superficial cervical adenopathy.    Left cervical: No superficial cervical adenopathy.  Skin:    General: Skin is warm and dry.     Capillary Refill: Capillary refill takes less than 2 seconds.     Findings: No rash.  Neurological:     Mental Status: She is alert and oriented to person, place, and time.     Cranial Nerves: Cranial nerves 2-12 are intact.  Psychiatric:        Mood and Affect: Mood normal.      UC Treatments / Results  Labs (all labs ordered are listed, but only abnormal results are displayed) Labs Reviewed  POCT URINALYSIS DIP (MANUAL ENTRY) - Abnormal; Notable for the following components:      Result Value   Blood, UA trace-intact (*)    All other components within normal limits  POCT URINE PREGNANCY - Normal  POC COVID19/FLU A&B COMBO - Normal    EKG   Radiology No results found.  Procedures Procedures  (including critical care time)  Medications Ordered in UC Medications - No data to display  Initial Impression / Assessment and Plan / UC Course  I have reviewed the triage vital signs and the nursing notes.  Pertinent labs & imaging results that were available during my care of the patient were reviewed by me and considered in my medical decision making (see chart for details).  Flu and COVID test are negative.  Orthostatic pulse and pressure are normal.  Get well-hydrated.  Patient is significantly anemic.  Start ferrous sulfate , 325 mg, 1 daily, OTC.  Follow-up with OB/GYN or family doctor regarding anemia.  Return here if symptoms do not improve, worsen, or new symptoms occur. Final Clinical Impressions(s) / UC Diagnoses   Final diagnoses:  Dizziness  Acute cough  Anemia, unspecified type  Irregular menstrual cycle     Discharge Instructions      COVID and flu testing were negative.  Orthostatic blood pressure and pulse readings were normal.  Patient had a hemoglobin of 9.6 with her GYN physical in the last month.  She has moderate anemia.  Encouraged good fluid intake and hydration.  Started on iron  supplement, ferrous sulfate , 325 mg, daily.  Follow-up with family  doctor or GYN regarding anemia.  Return here if symptoms do not improve, worsen, or new symptoms occur.     ED Prescriptions     Medication Sig Dispense Auth. Provider   ferrous sulfate  325 (65 FE) MG tablet Take 1 tablet (325 mg total) by mouth daily. 30 tablet Tujuana Kilmartin, FNP      PDMP not reviewed this encounter.   Ival Domino, FNP 03/04/23 [phone]

## 2023-06-20 LAB — OB RESULTS CONSOLE GC/CHLAMYDIA
Chlamydia: NEGATIVE
Neisseria Gonorrhea: NEGATIVE

## 2023-06-20 LAB — OB RESULTS CONSOLE PLATELET COUNT: Platelets: 487

## 2023-06-20 LAB — OB RESULTS CONSOLE RUBELLA ANTIBODY, IGM
Rubella: IMMUNE
Rubella: NON-IMMUNE/NOT IMMUNE

## 2023-06-20 LAB — OB RESULTS CONSOLE HEPATITIS B SURFACE ANTIGEN
Hepatitis B Surface Ag: NEGATIVE
Hepatitis B Surface Ag: NEGATIVE

## 2023-06-20 LAB — OB RESULTS CONSOLE HIV ANTIBODY (ROUTINE TESTING)
HIV: NONREACTIVE
HIV: NONREACTIVE

## 2023-06-20 LAB — OB RESULTS CONSOLE VARICELLA ZOSTER ANTIBODY, IGG: Varicella: NON-IMMUNE/NOT IMMUNE

## 2023-06-20 LAB — OB RESULTS CONSOLE HGB/HCT, BLOOD
HCT: 34 (ref 29–41)
Hemoglobin: 10.6

## 2023-06-20 LAB — HEPATITIS C ANTIBODY
HCV Ab: NEGATIVE
HCV Ab: NEGATIVE

## 2023-06-20 LAB — OB RESULTS CONSOLE RPR: RPR: NONREACTIVE

## 2023-06-20 LAB — OB RESULTS CONSOLE ANTIBODY SCREEN: Antibody Screen: NEGATIVE

## 2023-09-28 ENCOUNTER — Ambulatory Visit
Admission: EM | Admit: 2023-09-28 | Discharge: 2023-09-28 | Disposition: A | Discharge status: Home / Self Care | Attending: Family Medicine | Admitting: Family Medicine

## 2023-09-28 ENCOUNTER — Other Ambulatory Visit: Payer: Self-pay

## 2023-09-28 DIAGNOSIS — R519 Headache, unspecified: Secondary | ICD-10-CM | POA: Diagnosis not present

## 2023-09-28 DIAGNOSIS — R0602 Shortness of breath: Secondary | ICD-10-CM

## 2023-09-28 DIAGNOSIS — Z3493 Encounter for supervision of normal pregnancy, unspecified, third trimester: Secondary | ICD-10-CM

## 2023-09-28 MED ORDER — ACETAMINOPHEN 325 MG PO TABS
650.0000 mg | ORAL_TABLET | Freq: Once | ORAL | Status: AC
  Administered 2023-09-28: 650 mg via ORAL

## 2023-09-28 NOTE — ED Notes (Signed)
 Patient is being discharged from the Urgent Care and sent to the Emergency Department via POV . Per Myla Bold NP, patient is in need of higher level of care due to SOB. Patient is aware and verbalizes understanding of plan of care.  Vitals:   09/28/23 0918  BP: 116/82  Pulse: 86  Resp: 18  Temp: 98.6 F (37 C)  SpO2: 97%

## 2023-09-28 NOTE — ED Provider Notes (Signed)
 [facility]    CSN: [id number] Arrival date & time: 09/28/23  0910      History   Chief Complaint No chief complaint on file.   HPI Jill Herrera is a 33 y.o. female with a past medical history of anemia presents for shortness of breath.  Patient is currently 8 months pregnant and states while driving to work she felt like she could not catch her breath.  States she could feel her heart racing as well.  This prompted her to come to the clinic.  She also states since being in the clinic she developed a mild frontal headache that is not the worst headache of her life.  She denies any cough, congestion, sore throat/respiratory symptoms.  No fevers.  Denies any lower extremity swelling.  Denies any complications during pregnancy to date and is not on any medications outside of prenatals.  Denies any vaginal bleeding or abdominal pain/contractions/Braxton Hicks.  She states she feels her shortness of breath is improving.  HPI  Past Medical History:  Diagnosis Date   Anemia    Fibroid of cervix     Patient Active Problem List   Diagnosis Date Noted   Status post primary low transverse cesarean section 06/09/2021   Maternal care due to uterine scar from other previous surgery 06/09/2021   Postpartum care following cesarean delivery 06/09/2021   PCOS (polycystic ovarian syndrome) 02/05/2015    Past Surgical History:  Procedure Laterality Date   CESAREAN SECTION N/A 06/09/2021   Procedure: CESAREAN SECTION;  Surgeon: Delana Ted Morrison, DO;  Location: [facility];  Service: Obstetrics;  Laterality: N/A;  request 2nd scrub tech or RNFA   ROBOT ASSISTED MYOMECTOMY N/A 06/2011   UTERINE FIBROID SURGERY      OB History     Gravida  2   Para  1   Term  1   Preterm  0   AB  0   Living  1      SAB  0   IAB  0   Ectopic  0   Multiple  0   Live Births  1            Home Medications    Prior to Admission medications   Medication Sig Start Date End  Date Taking? Authorizing Provider  albuterol  (PROAIR  HFA) 108 (90 Base) MCG/ACT inhaler Inhale 1-2 puffs into the lungs every 4 (four) hours as needed for wheezing or shortness of breath. 05/10/18   Waddell Rake, MD  doxylamine, Sleep, (UNISOM) 25 MG tablet Take 25 mg by mouth at bedtime as needed for sleep (Every other night).    [provider]  ferrous sulfate  325 (65 FE) MG tablet Take 1 tablet (325 mg total) by mouth daily. 03/04/23 04/03/23  Ival Domino, FNP  Prenatal Vit-Fe Fumarate-FA (PRENATAL VITAMIN PO) Take 1 tablet by mouth daily.    [provider]    Family History Family History  Problem Relation Age of Onset   Asthma Mother    Diabetes Mother    Miscarriages / [place] Mother     Social History Social History   Tobacco Use   Smoking status: Never   Smokeless tobacco: Never  Vaping Use   Vaping status: Never Used  Substance Use Topics   Alcohol use: Not Currently    Alcohol/week: 1.0 standard drink of alcohol    Types: 1 Glasses of wine per week    Comment: OCC GLASS OF WINE   Drug  use: No     Allergies   Other   Review of Systems Review of Systems  Respiratory:  Positive for shortness of breath.   Neurological:  Positive for headaches.     Physical Exam Triage Vital Signs ED Triage Vitals  Encounter Vitals Group     BP 09/28/23 0918 116/82     Girls Systolic BP Percentile --      Girls Diastolic BP Percentile --      Boys Systolic BP Percentile --      Boys Diastolic BP Percentile --      Pulse Rate 09/28/23 0918 86     Resp 09/28/23 0918 18     Temp 09/28/23 0918 98.6 F (37 C)     Temp Source 09/28/23 0918 Oral     SpO2 09/28/23 0918 97 %     Weight --      Height --      Head Circumference --      Peak Flow --      Pain Score 09/28/23 0915 6     Pain Loc --      Pain Education --      Exclude from Growth Chart --    No data found.  Updated Vital Signs BP 116/82   Pulse 86   Temp 98.6 F (37 C) (Oral)    Resp 18   LMP 04/12/2023 (Exact Date)   SpO2 97%   Breastfeeding No   Visual Acuity Right Eye Distance:   Left Eye Distance:   Bilateral Distance:    Right Eye Near:   Left Eye Near:    Bilateral Near:     Physical Exam Vitals and nursing note reviewed.  Constitutional:      General: She is not in acute distress.    Appearance: Normal appearance. She is not ill-appearing.  HENT:     Head: Normocephalic and atraumatic.  Eyes:     Pupils: Pupils are equal, round, and reactive to light.  Cardiovascular:     Rate and Rhythm: Normal rate and regular rhythm.     Heart sounds: Normal heart sounds.  Pulmonary:     Effort: Pulmonary effort is normal. No respiratory distress.     Breath sounds: Normal breath sounds. No stridor. No wheezing, rhonchi or rales.  Skin:    General: Skin is warm and dry.  Neurological:     General: No focal deficit present.     Mental Status: She is alert and oriented to person, place, and time.  Psychiatric:        Mood and Affect: Mood normal.        Behavior: Behavior normal.      UC Treatments / Results  Labs (all labs ordered are listed, but only abnormal results are displayed) Labs Reviewed - No data to display  EKG   Radiology No results found.  Procedures Procedures (including critical care time)  Medications Ordered in UC Medications  acetaminophen  (TYLENOL ) tablet 650 mg (650 mg Oral Given 09/28/23 0932)    Initial Impression / Assessment and Plan / UC Course  I have reviewed the triage vital signs and the nursing notes.  Pertinent labs & imaging results that were available during my care of the patient were reviewed by me and considered in my medical decision making (see chart for details).     Reviewed exam and symptoms with patient.  Vitals are stable and pulmonary exam reassuring.  She was given Tylenol  in clinic for her  headache.  Patient was monitored for 30 minutes with significant improvement of her shortness of  breath and near resolution of her headache.  She states she feels much better and is comfortable going home and monitoring symptoms.  Did discuss MAU today but she declines stating she will continue to monitor her symptoms at home.  I did advise her to call her OB to make them aware of her symptoms so they can follow-up with her ASAP.  Also instructed to go to the ER for any new/worsening symptoms or if her symptoms do not continue to improve.  Patient verbalized understanding these instructions. Final Clinical Impressions(s) / UC Diagnoses   Final diagnoses:  Acute nonintractable headache, unspecified headache type  Shortness of breath  Third trimester pregnancy     Discharge Instructions      Please go to [facility] emergency entrance C that is specifically for pregnant women if your symptoms do not continue to improve, do not resolve completely and/or you develop any new or worsening symptoms.  Also contact your OB today to make them aware of your symptoms so they can follow-up with you soon as possible.     ED Prescriptions   None    PDMP not reviewed this encounter.   Loreda Myla SAUNDERS, NP 09/28/23 [phone]

## 2023-09-28 NOTE — ED Triage Notes (Addendum)
 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 Pt states now that she is sitting here, her head is starting to hurt. Pt is able to speak in complete sentences.

## 2023-09-28 NOTE — Discharge Instructions (Addendum)
 Please go to [facility] emergency entrance C that is specifically for pregnant women if your symptoms do not continue to improve, do not resolve completely and/or you develop any new or worsening symptoms.  Also contact your OB today to make them aware of your symptoms so they can follow-up with you soon as possible.

## 2023-10-24 LAB — OB RESULTS CONSOLE PLATELET COUNT: Platelets: 366

## 2023-10-24 LAB — OB RESULTS CONSOLE HGB/HCT, BLOOD
HCT: 32 (ref 29–41)
Hemoglobin: 10.4

## 2023-12-13 ENCOUNTER — Encounter (HOSPITAL_COMMUNITY): Payer: Self-pay

## 2023-12-13 ENCOUNTER — Encounter (HOSPITAL_COMMUNITY): Payer: Self-pay | Admitting: *Deleted

## 2023-12-13 NOTE — Patient Instructions (Addendum)
 Jill Herrera  12/13/2023   Your procedure is scheduled on:  12/27/2023  Arrive at 0800 at Entrance C on [address] at [facility]  and [employer]. You are invited to use the FREE valet parking or use the Visitor's parking deck.  Pick up the phone at the desk and dial [phone].  Call this number if you have problems the morning of surgery: [phone]  Remember:   Do not eat food:(After Midnight) Desps de medianoche.  You may drink clear liquids until  ___0600__.  Clear liquids means a liquid you can see thru.  It can have color such as Cola or Kool aid.  Tea is OK and coffee as long as no milk or creamer of any kind.  Take these medicines the morning of surgery with A SIP OF WATER:  none   Do not wear jewelry, make-up or nail polish.  Do not wear lotions, powders, or perfumes. Do not wear deodorant.  Do not shave 48 hours prior to surgery.  Do not bring valuables to the hospital.  [facility] is not   responsible for any belongings or valuables brought to the hospital.  Contacts, dentures or bridgework may not be worn into surgery.  Leave suitcase in the car. After surgery it may be brought to your room.  For patients admitted to the hospital, checkout time is 11:00 AM the day of              discharge.      Please read over the following fact sheets that you were given:     Preparing for Surgery

## 2023-12-13 NOTE — Patient Instructions (Signed)
 Jill Herrera  12/13/2023   Your procedure is scheduled on:  12/27/2023  Arrive at 0800 at Entrance C on [address] at [facility]  and [employer]. You are invited to use the FREE valet parking or use the Visitor's parking deck.  Pick up the phone at the desk and dial [phone].  Call this number if you have problems the morning of surgery: [phone]  Remember:   Do not eat food:(After Midnight) Desps de medianoche.  You may drink clear liquids until  __0600___.  Clear liquids means a liquid you can see thru.  It can have color such as Cola or Kool aid.  Tea is OK and coffee as long as no milk or creamer of any kind.  Take these medicines the morning of surgery with A SIP OF WATER:  Bring your inhaler   Do not wear jewelry, make-up or nail polish.  Do not wear lotions, powders, or perfumes. Do not wear deodorant.  Do not shave 48 hours prior to surgery.  Do not bring valuables to the hospital.  [facility] is not   responsible for any belongings or valuables brought to the hospital.  Contacts, dentures or bridgework may not be worn into surgery.  Leave suitcase in the car. After surgery it may be brought to your room.  For patients admitted to the hospital, checkout time is 11:00 AM the day of              discharge.      Please read over the following fact sheets that you were given:     Preparing for Surgery

## 2023-12-24 NOTE — H&P (Signed)
 Jill Herrera is a 33 y.o. G19P1001 female presenting for scheduled repeat cesarean section at 95 0/7wks. Pt has a history of abdominal myomectomy and c/s x 1 Her pregnancy has been complicated by: previous history of ghtn - took asa  AMA  Elevated 1hr gtt but normal 3hr She is GBS neg. Received Tdap.  She declined genetic and carrier testing  OB History     Gravida  2   Para  1   Term  1   Preterm  0   AB  0   Living  1      SAB  0   IAB  0   Ectopic  0   Multiple  0   Live Births  1          Past Medical History:  Diagnosis Date   Anemia    Complication of anesthesia    Fibroid of cervix    History of postpartum hypertension    Past Surgical History:  Procedure Laterality Date   CESAREAN SECTION N/A 06/09/2021   Procedure: CESAREAN SECTION;  Surgeon: Delana Ted Morrison, DO;  Location: [facility] LD ORS;  Service: Obstetrics;  Laterality: N/A;  request 2nd scrub tech or RNFA   ROBOT ASSISTED MYOMECTOMY N/A 06/2011   UTERINE FIBROID SURGERY     Family History: family history includes Asthma in her mother; Diabetes in her mother; Miscarriages / Stillbirths in her mother. Social History:  reports that she has never smoked. She has never used smokeless tobacco. She reports that she does not currently use alcohol after a past usage of about 1.0 standard drink of alcohol per week. She reports that she does not use drugs.     Maternal Diabetes: No Genetic Screening: Declined Maternal Ultrasounds/Referrals: Normal Fetal Ultrasounds or other Referrals:  None Maternal Substance Abuse:  No Significant Maternal Medications:  None Significant Maternal Lab Results:  Group B Strep negative Number of Prenatal Visits:greater than 3 verified prenatal visits Maternal Vaccinations:TDap Other Comments:  None  Review of Systems  Constitutional:  Positive for activity change and fatigue.  Eyes:  Negative for photophobia and visual disturbance.  Respiratory:  Negative for  chest tightness and shortness of breath.   Cardiovascular:  Positive for leg swelling. Negative for chest pain and palpitations.  Genitourinary:  Positive for pelvic pain. Negative for vaginal bleeding.  Musculoskeletal:  Negative for myalgias.  Neurological:  Negative for facial asymmetry, light-headedness and headaches.  Psychiatric/Behavioral:  The patient is nervous/anxious.    Maternal Medical History:  Reason for admission: Scheduled repeat cesarean section   Contractions: Frequency: rare.   Perceived severity is mild.   Fetal activity: Perceived fetal activity is normal.   Prenatal complications: no prenatal complications Prenatal Complications - Diabetes: none.     Last menstrual period 04/12/2023. Maternal Exam:  Uterine Assessment: Contraction strength is mild.  Contraction frequency is rare.  Abdomen: Patient reports generalized tenderness.  Estimated fetal weight is AGA.   Fetal presentation: vertex Introitus: Normal vulva. Vulva is negative for condylomata and lesion.  Normal vagina.  Vagina is negative for condylomata.  Pelvis: of concern for delivery.   Cervix: Cervix evaluated by digital exam.     Physical Exam Vitals and nursing note reviewed. Exam conducted with a chaperone present.  Constitutional:      Appearance: Normal appearance.  Cardiovascular:     Rate and Rhythm: Normal rate.     Pulses: Normal pulses.  Pulmonary:     Effort: Pulmonary effort is normal.  Abdominal:     Palpations: Abdomen is soft.     Tenderness: There is generalized abdominal tenderness.  Genitourinary:    General: Normal vulva.  Vulva is no lesion.  Musculoskeletal:        General: Swelling present. Normal range of motion.     Cervical back: Normal range of motion.  Skin:    General: Skin is warm and dry.     Capillary Refill: Capillary refill takes 2 to 3 seconds.  Neurological:     General: No focal deficit present.     Mental Status: She is alert and oriented to  person, place, and time. Mental status is at baseline.  Psychiatric:        Mood and Affect: Mood normal.        Behavior: Behavior normal.        Thought Content: Thought content normal.        Judgment: Judgment normal.     Prenatal labs: ABO, Rh:   Antibody: Negative (04/30 0000) Rubella: Immune, Nonimmune (04/30 0000) RPR: Nonreactive (04/30 0000)  HBsAg: Negative, Negative (04/30 0000)  HIV: Non-reactive, Non-reactive (04/30 0000)  GBS:     Assessment/Plan: 33yo G2P1001 at 37 0/7wks for repeat cesarean section, hx abdominal myomectomy - Admit - ERAS protocol  - Consent confirmed  - To OR when ready    Ted ORN Stormy Connon 12/24/2023, 11:44 AM

## 2023-12-25 ENCOUNTER — Encounter (HOSPITAL_COMMUNITY)
Admission: RE | Admit: 2023-12-25 | Discharge: 2023-12-25 | Disposition: A | Discharge status: Home / Self Care | Source: Ambulatory Visit | Attending: Obstetrics and Gynecology | Admitting: Obstetrics and Gynecology

## 2023-12-25 DIAGNOSIS — O3429 Maternal care due to uterine scar from other previous surgery: Secondary | ICD-10-CM | POA: Insufficient documentation

## 2023-12-25 DIAGNOSIS — Z3A36 36 weeks gestation of pregnancy: Secondary | ICD-10-CM | POA: Insufficient documentation

## 2023-12-25 DIAGNOSIS — Z01812 Encounter for preprocedural laboratory examination: Secondary | ICD-10-CM | POA: Insufficient documentation

## 2023-12-25 HISTORY — DX: Personal history of other diseases of the circulatory system: Z86.79

## 2023-12-25 HISTORY — DX: Other complications of anesthesia, initial encounter: T88.59XA

## 2023-12-25 LAB — CBC
HCT: 33.4 % — ABNORMAL LOW (ref 36.0–46.0)
Hemoglobin: 10.9 g/dL — ABNORMAL LOW (ref 12.0–15.0)
MCH: 24.1 pg — ABNORMAL LOW (ref 26.0–34.0)
MCHC: 32.6 g/dL (ref 30.0–36.0)
MCV: 73.7 fL — ABNORMAL LOW (ref 80.0–100.0)
Platelets: 345 K/uL (ref 150–400)
RBC: 4.53 MIL/uL (ref 3.87–5.11)
RDW: 15.2 % (ref 11.5–15.5)
WBC: 5.8 K/uL (ref 4.0–10.5)
nRBC: 0 % (ref 0.0–0.2)

## 2023-12-25 LAB — TYPE AND SCREEN
ABO/RH(D): O POS
Antibody Screen: NEGATIVE

## 2023-12-26 LAB — RPR: RPR Ser Ql: NONREACTIVE

## 2023-12-27 ENCOUNTER — Inpatient Hospital Stay (HOSPITAL_COMMUNITY)
Admission: RE | Admit: 2023-12-27 | Discharge: 2023-12-29 | DRG: 788 | Disposition: A | Discharge status: Home / Self Care | Attending: Obstetrics and Gynecology | Admitting: Obstetrics and Gynecology

## 2023-12-27 ENCOUNTER — Other Ambulatory Visit: Payer: Self-pay

## 2023-12-27 ENCOUNTER — Inpatient Hospital Stay (HOSPITAL_COMMUNITY)

## 2023-12-27 ENCOUNTER — Encounter (HOSPITAL_COMMUNITY)
Admission: RE | Disposition: A | Discharge status: Home / Self Care | Payer: Self-pay | Source: Home / Self Care | Attending: Obstetrics and Gynecology

## 2023-12-27 ENCOUNTER — Encounter (HOSPITAL_COMMUNITY): Payer: Self-pay | Admitting: Obstetrics and Gynecology

## 2023-12-27 DIAGNOSIS — O9081 Anemia of the puerperium: Secondary | ICD-10-CM | POA: Diagnosis not present

## 2023-12-27 DIAGNOSIS — Z833 Family history of diabetes mellitus: Secondary | ICD-10-CM | POA: Diagnosis not present

## 2023-12-27 DIAGNOSIS — O3429 Maternal care due to uterine scar from other previous surgery: Principal | ICD-10-CM | POA: Diagnosis present

## 2023-12-27 DIAGNOSIS — O34211 Maternal care for low transverse scar from previous cesarean delivery: Secondary | ICD-10-CM | POA: Diagnosis present

## 2023-12-27 DIAGNOSIS — Z3A37 37 weeks gestation of pregnancy: Secondary | ICD-10-CM

## 2023-12-27 DIAGNOSIS — L910 Hypertrophic scar: Secondary | ICD-10-CM | POA: Diagnosis present

## 2023-12-27 DIAGNOSIS — O99214 Obesity complicating childbirth: Secondary | ICD-10-CM | POA: Diagnosis present

## 2023-12-27 LAB — CBC
HCT: 34.3 % — ABNORMAL LOW (ref 36.0–46.0)
Hemoglobin: 11.4 g/dL — ABNORMAL LOW (ref 12.0–15.0)
MCH: 24 pg — ABNORMAL LOW (ref 26.0–34.0)
MCHC: 33.2 g/dL (ref 30.0–36.0)
MCV: 72.2 fL — ABNORMAL LOW (ref 80.0–100.0)
Platelets: 334 K/uL (ref 150–400)
RBC: 4.75 MIL/uL (ref 3.87–5.11)
RDW: 14.7 % (ref 11.5–15.5)
WBC: 8.8 K/uL (ref 4.0–10.5)
nRBC: 0 % (ref 0.0–0.2)

## 2023-12-27 LAB — OB RESULTS CONSOLE GBS: GBS: NEGATIVE

## 2023-12-27 LAB — CREATININE, SERUM
Creatinine, Ser: 0.58 mg/dL (ref 0.44–1.00)
GFR, Estimated: >60 mL/min (ref >60)

## 2023-12-27 SURGERY — Surgical Case
Anesthesia: Spinal | Site: Abdomen

## 2023-12-27 MED ORDER — BUPIVACAINE IN DEXTROSE 0.75-8.25 % IT SOLN
INTRATHECAL | Status: DC | PRN
Start: 2023-12-27 — End: 2023-12-27
  Administered 2023-12-27: 1.6 mL via INTRATHECAL

## 2023-12-27 MED ORDER — SCOPOLAMINE 1 MG/3DAYS TD PT72
1.0000 | MEDICATED_PATCH | Freq: Once | TRANSDERMAL | Status: DC
  Administered 2023-12-27: 1 mg via TRANSDERMAL

## 2023-12-27 MED ORDER — COCONUT OIL OIL: 1.0000 | TOPICAL_OIL | Status: DC | PRN

## 2023-12-27 MED ORDER — MORPHINE SULFATE (PF) 0.5 MG/ML IJ SOLN
INTRAMUSCULAR | Status: AC
  Filled 2023-12-27: qty 10

## 2023-12-27 MED ORDER — DEXAMETHASONE SOD PHOSPHATE PF 10 MG/ML IJ SOLN
INTRAMUSCULAR | Status: DC | PRN
  Administered 2023-12-27: 10 mg via INTRAVENOUS

## 2023-12-27 MED ORDER — TRANEXAMIC ACID 1000 MG/10ML IV SOLN
INTRAVENOUS | Status: DC | PRN
  Administered 2023-12-27: 1000 mg via INTRAVENOUS

## 2023-12-27 MED ORDER — DROPERIDOL 2.5 MG/ML IJ SOLN: 0.6250 mg | Freq: Once | INTRAMUSCULAR | Status: DC | PRN

## 2023-12-27 MED ORDER — SOD CITRATE-CITRIC ACID 500-334 MG/5ML PO SOLN
ORAL | Status: AC
Start: 2023-12-27 — End: 2023-12-27
  Filled 2023-12-27: qty 30

## 2023-12-27 MED ORDER — ZOLPIDEM TARTRATE 5 MG PO TABS: 5.0000 mg | ORAL_TABLET | Freq: Every evening | ORAL | Status: DC | PRN

## 2023-12-27 MED ORDER — SOD CITRATE-CITRIC ACID 500-334 MG/5ML PO SOLN
30.0000 mL | ORAL | Status: AC
  Administered 2023-12-27: 30 mL via ORAL

## 2023-12-27 MED ORDER — SODIUM CHLORIDE 0.9 % IV SOLN: INTRAVENOUS | Status: DC

## 2023-12-27 MED ORDER — OXYCODONE HCL 5 MG PO TABS: 5.0000 mg | ORAL_TABLET | Freq: Once | ORAL | Status: DC | PRN

## 2023-12-27 MED ORDER — KETOROLAC TROMETHAMINE 30 MG/ML IJ SOLN
30.0000 mg | Freq: Four times a day (QID) | INTRAMUSCULAR | Status: DC | PRN
  Administered 2023-12-27: 30 mg via INTRAVENOUS

## 2023-12-27 MED ORDER — SODIUM CHLORIDE 0.9 % IR SOLN
Status: DC | PRN
  Administered 2023-12-27: 1

## 2023-12-27 MED ORDER — OXYTOCIN-SODIUM CHLORIDE 30-0.9 UT/500ML-% IV SOLN: 2.5000 [IU]/h | INTRAVENOUS | Status: AC

## 2023-12-27 MED ORDER — DIPHENHYDRAMINE HCL 25 MG PO CAPS: 25.0000 mg | ORAL_CAPSULE | Freq: Four times a day (QID) | ORAL | Status: DC | PRN

## 2023-12-27 MED ORDER — PHENYLEPHRINE HCL (PRESSORS) 10 MG/ML IV SOLN
INTRAVENOUS | Status: DC | PRN
  Administered 2023-12-27 (×2): 80 ug via INTRAVENOUS

## 2023-12-27 MED ORDER — NALOXONE HCL 4 MG/10ML IJ SOLN: 1.0000 ug/kg/h | INTRAVENOUS | Status: DC | PRN

## 2023-12-27 MED ORDER — LACTATED RINGERS IV SOLN: INTRAVENOUS | Status: AC

## 2023-12-27 MED ORDER — FENTANYL CITRATE (PF) 100 MCG/2ML IJ SOLN
25.0000 ug | INTRAMUSCULAR | Status: DC | PRN
  Administered 2023-12-27: 25 ug via INTRAVENOUS
  Administered 2023-12-27: 50 ug via INTRAVENOUS

## 2023-12-27 MED ORDER — SIMETHICONE 80 MG PO CHEW: 80.0000 mg | CHEWABLE_TABLET | ORAL | Status: DC | PRN

## 2023-12-27 MED ORDER — FENTANYL CITRATE (PF) 100 MCG/2ML IJ SOLN
INTRAMUSCULAR | Status: AC
  Filled 2023-12-27: qty 2

## 2023-12-27 MED ORDER — CEFAZOLIN SODIUM-DEXTROSE 2-4 GM/100ML-% IV SOLN
INTRAVENOUS | Status: AC
  Filled 2023-12-27: qty 100

## 2023-12-27 MED ORDER — DEXMEDETOMIDINE HCL IN NACL 80 MCG/20ML IV SOLN
INTRAVENOUS | Status: DC | PRN
  Administered 2023-12-27: 8 ug via INTRAVENOUS

## 2023-12-27 MED ORDER — LACTATED RINGERS IV SOLN: INTRAVENOUS | Status: DC

## 2023-12-27 MED ORDER — KETOROLAC TROMETHAMINE 30 MG/ML IJ SOLN
INTRAMUSCULAR | Status: AC
  Filled 2023-12-27: qty 1

## 2023-12-27 MED ORDER — POVIDONE-IODINE 10 % EX SWAB: 2.0000 | Freq: Once | CUTANEOUS | Status: DC

## 2023-12-27 MED ORDER — ONDANSETRON HCL 4 MG/2ML IJ SOLN
4.0000 mg | Freq: Three times a day (TID) | INTRAMUSCULAR | Status: DC | PRN
Start: 2023-12-27 — End: 2023-12-29
  Administered 2023-12-27: 4 mg via INTRAVENOUS
  Filled 2023-12-27: qty 2

## 2023-12-27 MED ORDER — FENTANYL CITRATE (PF) 100 MCG/2ML IJ SOLN
INTRAMUSCULAR | Status: DC | PRN
  Administered 2023-12-27: 15 ug via INTRATHECAL

## 2023-12-27 MED ORDER — ONDANSETRON HCL 4 MG/2ML IJ SOLN
INTRAMUSCULAR | Status: DC | PRN
  Administered 2023-12-27: 4 mg via INTRAVENOUS

## 2023-12-27 MED ORDER — ENOXAPARIN SODIUM 40 MG/0.4ML IJ SOSY
40.0000 mg | PREFILLED_SYRINGE | INTRAMUSCULAR | Status: DC
  Administered 2023-12-28 – 2023-12-29 (×2): 40 mg via SUBCUTANEOUS
  Filled 2023-12-27 (×2): qty 0.4

## 2023-12-27 MED ORDER — DIBUCAINE (PERIANAL) 1 % EX OINT: 1.0000 | TOPICAL_OINTMENT | CUTANEOUS | Status: DC | PRN

## 2023-12-27 MED ORDER — ACETAMINOPHEN 500 MG PO TABS: 1000.0000 mg | ORAL_TABLET | Freq: Four times a day (QID) | ORAL | Status: DC

## 2023-12-27 MED ORDER — CEFAZOLIN SODIUM-DEXTROSE 2-4 GM/100ML-% IV SOLN
2.0000 g | INTRAVENOUS | Status: AC
  Administered 2023-12-27: 2 g via INTRAVENOUS

## 2023-12-27 MED ORDER — KETOROLAC TROMETHAMINE 30 MG/ML IJ SOLN
30.0000 mg | Freq: Once | INTRAMUSCULAR | Status: AC | PRN
Start: 2023-12-27 — End: 2023-12-27
  Administered 2023-12-27: 30 mg via INTRAVENOUS

## 2023-12-27 MED ORDER — MORPHINE SULFATE (PF) 0.5 MG/ML IJ SOLN
INTRAMUSCULAR | Status: DC | PRN
  Administered 2023-12-27: 150 ug via INTRATHECAL

## 2023-12-27 MED ORDER — DIPHENHYDRAMINE HCL 50 MG/ML IJ SOLN
12.5000 mg | INTRAMUSCULAR | Status: DC | PRN
  Administered 2023-12-28: 12.5 mg via INTRAVENOUS
  Filled 2023-12-27: qty 1

## 2023-12-27 MED ORDER — ACETAMINOPHEN 500 MG PO TABS
1000.0000 mg | ORAL_TABLET | Freq: Four times a day (QID) | ORAL | Status: DC
  Administered 2023-12-27 – 2023-12-29 (×6): 1000 mg via ORAL
  Filled 2023-12-27 (×7): qty 2

## 2023-12-27 MED ORDER — PRENATAL MULTIVITAMIN CH
1.0000 | ORAL_TABLET | Freq: Every day | ORAL | Status: DC
  Administered 2023-12-28: 1 via ORAL
  Filled 2023-12-27 (×2): qty 1

## 2023-12-27 MED ORDER — WITCH HAZEL-GLYCERIN EX PADS: 1.0000 | MEDICATED_PAD | CUTANEOUS | Status: DC | PRN

## 2023-12-27 MED ORDER — OXYTOCIN-SODIUM CHLORIDE 30-0.9 UT/500ML-% IV SOLN
INTRAVENOUS | Status: DC | PRN
Start: 2023-12-27 — End: 2023-12-27
  Administered 2023-12-27: 300 mL via INTRAVENOUS

## 2023-12-27 MED ORDER — ONDANSETRON HCL 4 MG/2ML IJ SOLN
INTRAMUSCULAR | Status: AC
  Filled 2023-12-27: qty 2

## 2023-12-27 MED ORDER — FERROUS SULFATE 325 (65 FE) MG PO TABS: 325.0000 mg | ORAL_TABLET | ORAL | Status: DC

## 2023-12-27 MED ORDER — MEPERIDINE HCL 25 MG/ML IJ SOLN: 6.2500 mg | INTRAMUSCULAR | Status: DC | PRN

## 2023-12-27 MED ORDER — ACETAMINOPHEN 10 MG/ML IV SOLN
INTRAVENOUS | Status: DC | PRN
  Administered 2023-12-27: 1000 mg via INTRAVENOUS

## 2023-12-27 MED ORDER — OXYCODONE HCL 5 MG PO TABS
5.0000 mg | ORAL_TABLET | ORAL | Status: DC | PRN
  Administered 2023-12-28 – 2023-12-29 (×3): 5 mg via ORAL
  Filled 2023-12-27 (×3): qty 1

## 2023-12-27 MED ORDER — PHENYLEPHRINE HCL-NACL 20-0.9 MG/250ML-% IV SOLN: INTRAVENOUS | Status: DC | PRN

## 2023-12-27 MED ORDER — NALOXONE HCL 0.4 MG/ML IJ SOLN: 0.4000 mg | INTRAMUSCULAR | Status: DC | PRN

## 2023-12-27 MED ORDER — OXYCODONE HCL 5 MG/5ML PO SOLN: 5.0000 mg | Freq: Once | ORAL | Status: DC | PRN

## 2023-12-27 MED ORDER — PHENYLEPHRINE HCL-NACL 20-0.9 MG/250ML-% IV SOLN
INTRAVENOUS | Status: DC | PRN
Start: 2023-12-27 — End: 2023-12-27
  Administered 2023-12-27: 30 ug/min via INTRAVENOUS

## 2023-12-27 MED ORDER — MENTHOL 3 MG MT LOZG: 1.0000 | LOZENGE | OROMUCOSAL | Status: DC | PRN

## 2023-12-27 MED ORDER — SODIUM CHLORIDE 0.9% FLUSH: 3.0000 mL | INTRAVENOUS | Status: DC | PRN

## 2023-12-27 MED ORDER — SIMETHICONE 80 MG PO CHEW
80.0000 mg | CHEWABLE_TABLET | Freq: Three times a day (TID) | ORAL | Status: DC
  Administered 2023-12-28 – 2023-12-29 (×4): 80 mg via ORAL
  Filled 2023-12-27 (×6): qty 1

## 2023-12-27 MED ORDER — STERILE WATER FOR IRRIGATION IR SOLN
Status: DC | PRN
  Administered 2023-12-27: 1

## 2023-12-27 MED ORDER — SENNOSIDES-DOCUSATE SODIUM 8.6-50 MG PO TABS
2.0000 | ORAL_TABLET | Freq: Every day | ORAL | Status: DC
  Administered 2023-12-28 – 2023-12-29 (×2): 2 via ORAL
  Filled 2023-12-27 (×2): qty 2

## 2023-12-27 MED ORDER — DIPHENHYDRAMINE HCL 25 MG PO CAPS: 25.0000 mg | ORAL_CAPSULE | ORAL | Status: DC | PRN

## 2023-12-27 MED ORDER — SCOPOLAMINE 1 MG/3DAYS TD PT72
MEDICATED_PATCH | TRANSDERMAL | Status: AC
  Filled 2023-12-27: qty 1

## 2023-12-27 MED ORDER — IBUPROFEN 600 MG PO TABS
600.0000 mg | ORAL_TABLET | Freq: Four times a day (QID) | ORAL | Status: DC
  Administered 2023-12-27 – 2023-12-29 (×6): 600 mg via ORAL
  Filled 2023-12-27 (×8): qty 1

## 2023-12-27 SURGICAL SUPPLY — 35 items
BENZOIN TINCTURE PRP APPL 2/3 (GAUZE/BANDAGES/DRESSINGS) IMPLANT
CHLORAPREP W/TINT 26ML (MISCELLANEOUS) ×2 IMPLANT
CLAMP UMBILICAL CORD (MISCELLANEOUS) ×1 IMPLANT
CLOTH BEACON ORANGE TIMEOUT ST (SAFETY) ×1 IMPLANT
DRAPE C SECTION CLR SCREEN (DRAPES) ×1 IMPLANT
DRSG OPSITE POSTOP 4X10 (GAUZE/BANDAGES/DRESSINGS) ×1 IMPLANT
ELECTRODE REM PT RTRN 9FT ADLT (ELECTROSURGICAL) ×1 IMPLANT
EXTRACTOR VACUUM KIWI (MISCELLANEOUS) IMPLANT
GAUZE PAD ABD 7.5X8 STRL (GAUZE/BANDAGES/DRESSINGS) IMPLANT
GAUZE SPONGE 4X4 12PLY STRL LF (GAUZE/BANDAGES/DRESSINGS) IMPLANT
GLOVE BIO SURGEON STRL SZ 6.5 (GLOVE) ×1 IMPLANT
GLOVE BIOGEL PI IND STRL 7.0 (GLOVE) ×2 IMPLANT
GOWN STRL REUS W/TWL LRG LVL3 (GOWN DISPOSABLE) ×2 IMPLANT
KIT ABG SYR 3ML LUER SLIP (SYRINGE) IMPLANT
MAT PREVALON FULL STRYKER (MISCELLANEOUS) IMPLANT
NDL HYPO 25X5/8 SAFETYGLIDE (NEEDLE) IMPLANT
NEEDLE HYPO 25X5/8 SAFETYGLIDE (NEEDLE) IMPLANT
NS IRRIG 1000ML POUR BTL (IV SOLUTION) ×1 IMPLANT
PACK C SECTION WH (CUSTOM PROCEDURE TRAY) ×1 IMPLANT
PAD OB MATERNITY 4.3X12.25 (PERSONAL CARE ITEMS) ×1 IMPLANT
RETAINER VISCERAL (MISCELLANEOUS) IMPLANT
RETRACTOR WND ALEXIS 25 LRG (MISCELLANEOUS) ×1 IMPLANT
RTRCTR C-SECT PINK 25CM LRG (MISCELLANEOUS) IMPLANT
STRIP CLOSURE SKIN 1/2X4 (GAUZE/BANDAGES/DRESSINGS) IMPLANT
SUT CHROMIC 1 CTX 36 (SUTURE) ×2 IMPLANT
SUT PLAIN 0 NONE (SUTURE) IMPLANT
SUT VIC AB 0 CT1 27XBRD ANBCTR (SUTURE) ×2 IMPLANT
SUT VIC AB 2-0 CT1 TAPERPNT 27 (SUTURE) ×1 IMPLANT
SUT VIC AB 4-0 KS 27 (SUTURE) ×1 IMPLANT
SUT VICRYL+ 3-0 36IN CT-1 (SUTURE) IMPLANT
SUTURE PLAIN GUT 2.0 ETHICON (SUTURE) ×1 IMPLANT
TAPE CLOTH SURG 4X10 WHT LF (GAUZE/BANDAGES/DRESSINGS) IMPLANT
TOWEL OR 17X24 6PK STRL BLUE (TOWEL DISPOSABLE) ×1 IMPLANT
TRAY FOLEY W/BAG SLVR 14FR LF (SET/KITS/TRAYS/PACK) ×1 IMPLANT
WATER STERILE IRR 1000ML POUR (IV SOLUTION) ×1 IMPLANT

## 2023-12-27 NOTE — Plan of Care (Signed)

## 2023-12-27 NOTE — Anesthesia Postprocedure Evaluation (Signed)
 Anesthesia Post Note  Patient: Deshunda Farver  Procedure(s) Performed: CESAREAN SECTION, WITH BILATERAL TUBAL LIGATION (Abdomen)     Patient location during evaluation: PACU Anesthesia Type: Spinal Level of consciousness: oriented and awake and alert Pain management: pain level controlled Vital Signs Assessment: post-procedure vital signs reviewed and stable Respiratory status: spontaneous breathing, respiratory function stable and patient connected to nasal cannula oxygen Cardiovascular status: blood pressure returned to baseline and stable Postop Assessment: no headache, no backache and no apparent nausea or vomiting Anesthetic complications: no   No notable events documented.  Last Vitals:  Vitals:   12/27/23 1230 12/27/23 1239  BP: 117/81 121/73  Pulse: 73 71  Resp: 12 16  Temp:  36.8 C  SpO2: 91% 95%    Last Pain:  Vitals:   12/27/23 1230  TempSrc:   PainSc: 3                  Thom JONELLE Peoples

## 2023-12-27 NOTE — Transfer of Care (Signed)
 Immediate Anesthesia Transfer of Care Note  Patient: Melyna Genco  Procedure(s) Performed: CESAREAN SECTION, WITH BILATERAL TUBAL LIGATION (Abdomen)  Patient Location: PACU  Anesthesia Type:Spinal  Level of Consciousness: awake, alert , and oriented  Airway & Oxygen Therapy: Patient Spontanous Breathing  Post-op Assessment: Report given to RN and Post -op Vital signs reviewed and stable  Post vital signs: Reviewed and stable  Last Vitals:  Vitals Value Taken Time  BP 111/76 12/27/23 11:32  Temp    Pulse 73 12/27/23 11:33  Resp 16 12/27/23 11:33  SpO2 99 % 12/27/23 11:33  Vitals shown include unfiled device data.  Last Pain:  Vitals:   12/27/23 0838  TempSrc: Oral         Complications: No notable events documented.

## 2023-12-27 NOTE — Anesthesia Preprocedure Evaluation (Addendum)
 Anesthesia Evaluation  Patient identified by MRN, date of birth, ID band Patient awake    Reviewed: Allergy & Precautions, H&P , NPO status , Patient's Chart, lab work & pertinent test results  Airway Mallampati: III  TM Distance: >3 FB Neck ROM: Full    Dental no notable dental hx.    Pulmonary neg pulmonary ROS   Pulmonary exam normal breath sounds clear to auscultation       Cardiovascular negative cardio ROS Normal cardiovascular exam Rhythm:Regular Rate:Normal     Neuro/Psych negative neurological ROS  negative psych ROS   GI/Hepatic negative GI ROS, Neg liver ROS,,,  Endo/Other  negative endocrine ROS    Renal/GU negative Renal ROS  negative genitourinary   Musculoskeletal negative musculoskeletal ROS (+)    Abdominal   Peds negative pediatric ROS (+)  Hematology  (+) Blood dyscrasia, anemia   Anesthesia Other Findings Plt 345 C-section with G1  Reproductive/Obstetrics (+) Pregnancy                              Anesthesia Physical Anesthesia Plan  ASA: 2  Anesthesia Plan: Spinal   Post-op Pain Management:    Induction:   PONV Risk Score and Plan: 2 and Treatment may vary due to age or medical condition  Airway Management Planned: Natural Airway  Additional Equipment:   Intra-op Plan:   Post-operative Plan:   Informed Consent: I have reviewed the patients History and Physical, chart, labs and discussed the procedure including the risks, benefits and alternatives for the proposed anesthesia with the patient or authorized representative who has indicated his/her understanding and acceptance.     Dental advisory given  Plan Discussed with: CRNA  Anesthesia Plan Comments:          Anesthesia Quick Evaluation

## 2023-12-27 NOTE — Op Note (Signed)
 Operative Note    Preoperative Diagnosis IUP at 37 0/7wks  Previous uterine incision from myomectomy and cesarean section  History of gestational hypertension in previous pregnancy  Maternal obesity - BMI 35   Postoperative Diagnosis: Same    Procedure: Repeat low transverse cesarean section with no extensions and with scar revision    Surgeon: Delana BROCKS DO  Assist: Gretta Gums MD   Anesthesia: Spinal   Fluids: LR  EBL: UOP: Meds: Ancef  and TXA  Findings: Viable female infant in vertex position. APGARS 9,9. Weight 7lbs 7oz . Grossly normal uterus, bilateral ovaries and fallopian tubes. Moderate rectus muscle scarring in midline    Specimen: Placenta to L/D   Procedure Note   Consent verified pre-op. All questions answered   Patient was taken to the operating room where spinal anesthesia was administered. She was prepped and draped in the normal sterile fashion and placed in the dorsal supine position with a leftward tilt. An appropriate time out was performed.  A Pfannenstiel skin incision was then made with her previous keloid scar excised. The incision was carried through to the underlying layer of fascia by using Bovie cautery. The fascia was nicked in the midline and the incision was extended laterally with Mayo scissors. The superior and inferior aspects of the incision were grasped using kocher clamps and dissected off the underlying rectus muscles. The rectus muscles were noted to be moderately adhesed. They were carefully separated in the midline . The incision was extended cephalad and caudad with caution and the underlying peritoneal cavity entered bluntly. The peritoneal incision was then also extended both superiorly and inferiorly with careful attention to avoid both bowel and bladder. The Alexis self-retaining wound retractor was then placed and the lower uterine segment exposed.  The bladder flap was developed with Metzenbaum scissors and  pushed away from the lower uterine segment.  The lower uterine segment was then incised in a transverse fashion and the cavity itself entered bluntly.   The infant's head was then lifted and delivered from the incision without difficulty. Vigorous spontaneous cry was noted during delivery.  The remainder of the infant delivered and the nose and mouth bulb suctioned. The cord was clamped and cut after a minute delay. The infant was handed off to the waiting NICU team.  The placenta was then spontaneously expressed from the uterus and the uterus cleared of all clots and debris with moist lap sponge. The uterine incision was then repaired in 2 layers the first layer was a running locked layer 1-0 chromic and the second an imbricating layer of the same suture. A small mid incision bleeding area was further suture ligated for better hemostasis - it required only one figure eight stitch.   Next the tubes and ovaries were inspected and the gutters cleared of all clots and debris. The uterine incision was inspected again and found to be hemostatic. All instruments and sponges were then removed from the abdomen.   The peritoneum was then reapproximated in a running fashion with sutures of 2-0 Vicryl.  The fascia was then closed with 0 Vicryl in a running fashion.  The skin was closed with a subcuticular stitch of 4-0 Vicryl on a Keith needle and then reinforced with benzoin, steri-strips and a honeycomb dressing.  At the conclusion of the procedure all instruments and sponge counts were correct. Patient was taken to the recovery room in good condition with her baby accompanying her skin to skin.

## 2023-12-27 NOTE — Anesthesia Procedure Notes (Signed)
 Spinal  Patient location during procedure: OR Start time: 12/27/2023 10:10 AM End time: 12/27/2023 10:15 AM Reason for block: surgical anesthesia Staffing Performed: anesthesiologist  Anesthesiologist: Erma Thom SAUNDERS, MD Performed by: Erma Thom SAUNDERS, MD Authorized by: Erma Thom SAUNDERS, MD   Preanesthetic Checklist Completed: patient identified, IV checked, site marked, risks and benefits discussed, surgical consent, monitors and equipment checked, pre-op evaluation and timeout performed Spinal Block Patient position: sitting Prep: DuraPrep Patient monitoring: heart rate, cardiac monitor, continuous pulse ox and blood pressure Approach: midline Location: L4-5 Needle Needle type: Pencan  Needle gauge: 24 G Assessment Sensory level: T6 Additional Notes Functioning IV was confirmed and monitors were applied. Sterile prep and drape, including hand hygiene and sterile gloves were used. The patient was positioned and the spine was prepped. The skin was anesthetized with lidocaine.  Free flow of clear CSF was obtained prior to injecting local anesthetic into the CSF.  The spinal needle aspirated freely following injection.  The needle was carefully withdrawn.  The patient tolerated the procedure well.

## 2023-12-28 LAB — CBC
HCT: 30.1 % — ABNORMAL LOW (ref 36.0–46.0)
Hemoglobin: 9.9 g/dL — ABNORMAL LOW (ref 12.0–15.0)
MCH: 23.9 pg — ABNORMAL LOW (ref 26.0–34.0)
MCHC: 32.9 g/dL (ref 30.0–36.0)
MCV: 72.7 fL — ABNORMAL LOW (ref 80.0–100.0)
Platelets: 308 K/uL (ref 150–400)
RBC: 4.14 MIL/uL (ref 3.87–5.11)
RDW: 14.8 % (ref 11.5–15.5)
WBC: 10.4 K/uL (ref 4.0–10.5)
nRBC: 0.3 % — ABNORMAL HIGH (ref 0.0–0.2)

## 2023-12-28 MED ORDER — FERROUS SULFATE 325 (65 FE) MG PO TABS
325.0000 mg | ORAL_TABLET | ORAL | Status: DC
  Administered 2023-12-29: 325 mg via ORAL
  Filled 2023-12-28: qty 1

## 2023-12-28 MED ORDER — SODIUM CHLORIDE 0.9 % IV SOLN
200.0000 mg | Freq: Once | INTRAVENOUS | Status: AC
  Administered 2023-12-28: 200 mg via INTRAVENOUS
  Filled 2023-12-28: qty 10

## 2023-12-28 MED ORDER — HYDROXYZINE HCL 25 MG PO TABS
25.0000 mg | ORAL_TABLET | Freq: Three times a day (TID) | ORAL | Status: DC | PRN
Start: 2023-12-28 — End: 2023-12-29
  Administered 2023-12-28: 25 mg via ORAL
  Filled 2023-12-28: qty 1

## 2023-12-28 NOTE — Lactation Note (Signed)
 This note was copied from a baby's chart. Lactation Consultation Note  Patient Name: Jill Herrera Date: 12/28/2023 Age:35 hours Reason for consult: Initial assessment;Early term 37-38.6wks;Breastfeeding assistance  P2- MOB reports that infant latches well, but it takes him a while to get latched. Infant was not due for a feeding, but MOB wanted to show LC how infant latches. Infant was alert and looking around. MOB placed infant on the right breast in the football hold. Infant latched with first attempt and nursed for 6 ish minutes before stopping to look around. MOB reports that infant had not latched this well before LC came into the room. LC reviewed how infant is about to be 24 hrs, so it is expected for him to be more alert and willing to eat. MOB denied having any questions or concerns.  LC reviewed the first 24 hr birthday nap, day 2 cluster feeding, feeding infant on cue 8-12x in 24 hrs, not allowing infant to go over 3 hrs without a feeding, CDC milk storage guidelines, LC services handout and engorgement/breast care. LC encouraged MOB to call for further assistance as needed.  Maternal Data Has patient been taught Hand Expression?: Yes Does the patient have breastfeeding experience prior to this delivery?: Yes How long did the patient breastfeed?: 1 year and 6 months  Feeding Mother's Current Feeding Choice: Breast Milk  LATCH Score Latch: Repeated attempts needed to sustain latch, nipple held in mouth throughout feeding, stimulation needed to elicit sucking reflex.  Audible Swallowing: A few with stimulation  Type of Nipple: Everted at rest and after stimulation  Comfort (Breast/Nipple): Soft / non-tender  Hold (Positioning): Assistance needed to correctly position infant at breast and maintain latch.  LATCH Score: 7   Lactation Tools Discussed/Used Pump Education: Milk Storage  Interventions Interventions: Breast feeding basics reviewed;Assisted with  latch;Hand express;Breast compression;Support pillows;Adjust position;Position options;Education;LC Services brochure  Discharge Discharge Education: Engorgement and breast care;Warning signs for feeding baby Pump: DEBP;Manual;Hands Free;Personal  Consult Status Consult Status: Follow-up Date: 12/29/23 Follow-up type: In-patient    Recardo Hoit BS, IBCLC 12/28/2023, 10:24 AM

## 2023-12-28 NOTE — Progress Notes (Addendum)
 Subjective: Postpartum Day 1: Cesarean Delivery Patient reports she is overall doing well. Pain is well controlled when she is in the bed, but notes increases when she gets out of bed or walks. Controlled with PO medication. Lochia is minimal. Foley removed overnight and she is voiding. Ambulating without difficulty. Has not passed flatus. Is tolerating regular diet. Notes intense itching. Some relief with benadryl , but mostly made her tired.    Objective: Vital signs in last 24 hours: Temp:  [97.8 F (36.6 C)-98.4 F (36.9 C)] 98.4 F (36.9 C) (11/06 1955) Pulse Rate:  [58-75] 71 (11/06 2300) Resp:  [12-19] 18 (11/06 2300) BP: (111-129)/(72-84) 122/74 (11/06 2300) SpO2:  [91 %-100 %] 97 % (11/06 2300)  Physical Exam:  General: alert and no distress Lochia: appropriate Uterine Fundus: firm Incision: honeycomb over-top w/o strike-though  DVT Evaluation: No evidence of DVT seen on physical exam.  Recent Labs    12/27/23 1513 12/28/23 0412  HGB 11.4* 9.9*  HCT 34.3* 30.1*    Assessment/Plan: Status post Cesarean section. Doing well postoperatively.   Acute blood loss anemia - IV iron today, then daily PO iron starting tomorrow  Itching - benadryl  switched to hydroxyzine   Lovenox for VTE prophylaxis Continue current care. Anticipate discharge POD2-3 Circumcision today  Charmaine CHRISTELLA Oz, MD 12/28/2023, 9:39 AM

## 2023-12-29 MED ORDER — IBUPROFEN 800 MG PO TABS: 800.0000 mg | ORAL_TABLET | Freq: Three times a day (TID) | ORAL | 1 refills | Status: AC | PRN

## 2023-12-29 MED ORDER — OXYCODONE HCL 5 MG PO TABS: 5.0000 mg | ORAL_TABLET | Freq: Four times a day (QID) | ORAL | 0 refills | Status: AC | PRN

## 2023-12-29 NOTE — Lactation Note (Signed)
 This note was copied from a baby's chart. Lactation Consultation Note  Patient Name: Boy Roiza Wiedel Unijb'd Date: 12/29/2023 Age:35 hours, P2 , experienced Breast feeder  Reason for consult: Follow-up assessment Baby awake and rooting. LC assisted with the cross cradle and worked on depth, swallows noted and still feeding at 18 mins.  LC reviewed breast feeding D/C teaching and the [facility] resources, see below.   Maternal Data Has patient been taught Hand Expression?: Yes Does the patient have breastfeeding experience prior to this delivery?: Yes  Feeding Mother's Current Feeding Choice: Breast Milk  LATCH Score Latch: Grasps breast easily, tongue down, lips flanged, rhythmical sucking.  Audible Swallowing: Spontaneous and intermittent  Type of Nipple: Everted at rest and after stimulation  Comfort (Breast/Nipple): Soft / non-tender  Hold (Positioning): Assistance needed to correctly position infant at breast and maintain latch.  LATCH Score: 9   Lactation Tools Discussed/Used Tools: Pump;Flanges Flange Size: 21;24;27 Breast pump type: Manual Pump Education: Setup, frequency, and cleaning;Milk Storage Reason for Pumping: PRN  Interventions Interventions: Breast feeding basics reviewed;Assisted with latch;Reverse pressure;Breast compression;Support pillows;Hand pump;Education;LC Services brochure;CDC milk storage guidelines;CDC Guidelines for Breast Pump Cleaning  Discharge Discharge Education: Engorgement and breast care;Warning signs for feeding baby;Outpatient recommendation (if needed) Pump: DEBP;Manual;Personal  Consult Status Consult Status: Complete Date: 12/29/23    Rollene Caldron Lynnet Hefley 12/29/2023, 10:09 AM

## 2023-12-29 NOTE — Plan of Care (Signed)
  Problem: Education: Goal: Knowledge of General Education information will improve Description: Including pain rating scale, medication(s)/side effects and non-pharmacologic comfort measures Outcome: Completed/Met   Problem: Health Behavior/Discharge Planning: Goal: Ability to manage health-related needs will improve Outcome: Completed/Met   Problem: Clinical Measurements: Goal: Ability to maintain clinical measurements within normal limits will improve Outcome: Completed/Met Goal: Will remain free from infection Outcome: Completed/Met Goal: Diagnostic test results will improve Outcome: Completed/Met Goal: Respiratory complications will improve Outcome: Completed/Met Goal: Cardiovascular complication will be avoided Outcome: Completed/Met   Problem: Activity: Goal: Risk for activity intolerance will decrease Outcome: Completed/Met   Problem: Nutrition: Goal: Adequate nutrition will be maintained Outcome: Completed/Met   Problem: Coping: Goal: Level of anxiety will decrease Outcome: Completed/Met   Problem: Elimination: Goal: Will not experience complications related to bowel motility Outcome: Completed/Met Goal: Will not experience complications related to urinary retention Outcome: Completed/Met   Problem: Pain Managment: Goal: General experience of comfort will improve and/or be controlled Outcome: Completed/Met   Problem: Safety: Goal: Ability to remain free from injury will improve Outcome: Completed/Met   Problem: Skin Integrity: Goal: Risk for impaired skin integrity will decrease Outcome: Completed/Met   Problem: Education: Goal: Knowledge of the prescribed therapeutic regimen will improve Outcome: Completed/Met Goal: Understanding of sexual limitations or changes related to disease process or condition will improve Outcome: Completed/Met Goal: Individualized Educational Video(s) Outcome: Completed/Met   Problem: Self-Concept: Goal: Communication of  feelings regarding changes in body function or appearance will improve Outcome: Completed/Met   Problem: Skin Integrity: Goal: Demonstration of wound healing without infection will improve Outcome: Completed/Met   Problem: Education: Goal: Knowledge of condition will improve Outcome: Completed/Met Goal: Individualized Educational Video(s) Outcome: Completed/Met Goal: Individualized Newborn Educational Video(s) Outcome: Completed/Met   Problem: Activity: Goal: Will verbalize the importance of balancing activity with adequate rest periods Outcome: Completed/Met Goal: Ability to tolerate increased activity will improve Outcome: Completed/Met   Problem: Coping: Goal: Ability to identify and utilize available resources and services will improve Outcome: Completed/Met   Problem: Life Cycle: Goal: Chance of risk for complications during the postpartum period will decrease Outcome: Completed/Met   Problem: Role Relationship: Goal: Ability to demonstrate positive interaction with newborn will improve Outcome: Completed/Met   Problem: Skin Integrity: Goal: Demonstration of wound healing without infection will improve Outcome: Completed/Met

## 2023-12-29 NOTE — Progress Notes (Signed)
 Subjective: Postpartum Day 2: Cesarean Delivery Patient reports she is overall doing well. Pain is well controlled when she is in the bed, but notes increases when she gets out of bed or walks. Controlled with PO medication. Lochia is minimal. Voiding without issue. Ambulating without difficulty. Has passed flatus. Is tolerating regular diet. Itching improved with Atarax  Objective: Vital signs in last 24 hours: Temp:  [98 F (36.7 C)-98.5 F (36.9 C)] 98.1 F (36.7 C) (11/08 0546) Pulse Rate:  [75-95] 75 (11/08 0546) Resp:  [18] 18 (11/08 0546) BP: (117-123)/(66-74) 117/66 (11/08 0546) SpO2:  [99 %-100 %] 100 % (11/08 0546)  Physical Exam:  General: alert and no distress Lochia: appropriate Uterine Fundus: firm Incision: honeycomb over-top w/o strike-though  DVT Evaluation: No evidence of DVT seen on physical exam.  Recent Labs    12/27/23 1513 12/28/23 0412  HGB 11.4* 9.9*  HCT 34.3* 30.1*    Assessment/Plan: Status post Cesarean section. Doing well postoperatively.   Acute blood loss anemia - IV iron yesterday, now daily PO iron  Itching - improved on atarax  Lovenox for VTE prophylaxis Continue current care. Baby s/p circ. DC home today with 2wk incision check and 6wk pp  Lavonia CHRISTELLA Guppy, MD 12/29/2023, 9:02 AM

## 2023-12-29 NOTE — Discharge Summary (Signed)
 Postpartum Discharge Summary  Date of Service updated     Patient Name: Jill Herrera DOB: [birth year] MRN: [id number]  Date of admission: 12/27/2023 Delivery date:12/27/2023 Delivering provider: DELANA TED MORRISON Date of discharge: 12/29/2023  Admitting diagnosis: History of cesarean section [Z98.891] Maternal care due to uterine scar from other previous surgery [O34.29] Intrauterine pregnancy: [redacted]w[redacted]d     Secondary diagnosis:  Principal Problem:   Maternal care due to uterine scar from other previous surgery  Additional problems: none    Discharge diagnosis: Term Pregnancy Delivered and Anemia                                              Post partum procedures:none Augmentation: N/A Complications: None  Hospital course: Sceduled C/S   33 y.o. yo G2P2002 at [redacted]w[redacted]d was admitted to the hospital 12/27/2023 for scheduled cesarean section with the following indication:Prior Uterine Surgery.Delivery details are as follows:  Membrane Rupture Time/Date: 10:43 AM,12/27/2023  Delivery Method:C-Section, Low Transverse Operative Delivery:N/A Details of operation can be found in separate operative note.  Patient had a postpartum course complicated by none.  She is ambulating, tolerating a regular diet, passing flatus, and urinating well. Patient is discharged home in stable condition on  12/29/23        Newborn Data: Birth date:12/27/2023 Birth time:10:43 AM Gender:Female Living status:Living Apgars:8 ,9  Weight:3180 g    Immunizations administered: Immunization History  Administered Date(s) Administered   Hep B, Unspecified 02/15/2005, 03/20/2005, 06/22/2005   Hepatitis A, Ped/Adol-2 Dose 07/19/2006   IPV 05/23/2006, 07/19/2006   Influenza Inj Mdck Quad With Preservative 11/05/2019   Influenza,inj,Quad PF,6+ Mos 11/16/2020   Influenza-Unspecified 03/20/2005   MMR 06/22/2005, 05/23/2006   Meningococcal Conjugate 07/19/2006   Moderna Sars-Covid-2 Vaccination 03/07/2019,  04/04/2019, 12/18/2019   Polio, Unspecified 02/15/2005   Td 09/13/2009   Td (Adult),5 Lf Tetanus Toxid, Preservative Free 07/19/2006   Tdap 05/23/2006, 03/31/2021, 10/24/2023   Tetanus 02/15/2005   Varicella 02/15/2005, 05/23/2006    Physical exam  Vitals:   12/27/23 2300 12/28/23 1502 12/28/23 1923 12/29/23 0546  BP: 122/74 120/74 123/68 117/66  Pulse: 71 76 95 75  Resp: 18 18 18 18   Temp:  98 F (36.7 C) 98.5 F (36.9 C) 98.1 F (36.7 C)  TempSrc:  Oral Oral Oral  SpO2: 97%  99% 100%  Weight:      Height:       General: alert, cooperative, and no distress Lochia: appropriate Uterine Fundus: firm Incision: Healing well with no significant drainage, No significant erythema, Dressing is clean, dry, and intact DVT Evaluation: No evidence of DVT seen on physical exam. Negative Homan's sign. No cords or calf tenderness. Labs: Lab Results  Component Value Date   WBC 10.4 12/28/2023   HGB 9.9 (L) 12/28/2023   HCT 30.1 (L) 12/28/2023   MCV 72.7 (L) 12/28/2023   PLT 308 12/28/2023      Latest Ref Rng & Units 12/27/2023    3:13 PM  CMP  Creatinine 0.44 - 1.00 mg/dL 9.41    Edinburgh Score:    12/27/2023    3:00 PM  Edinburgh Postnatal Depression Scale Screening Tool  I have been able to laugh and see the funny side of things. 0  I have looked forward with enjoyment to things. 0  I have blamed myself unnecessarily when things went wrong. 1  I have been anxious or worried for no good reason. 0  I have felt scared or panicky for no good reason. 0  Things have been getting on top of me. 0  I have been so unhappy that I have had difficulty sleeping. 0  I have felt sad or miserable. 0  I have been so unhappy that I have been crying. 0  The thought of harming myself has occurred to me. 0  Edinburgh Postnatal Depression Scale Total 1      After visit meds:  Allergies as of 12/29/2023       Reactions   Other    Raw Spinach - makes tongue tingle          Medication List     STOP taking these medications    doxylamine (Sleep) 25 MG tablet Commonly known as: UNISOM       TAKE these medications    albuterol  108 (90 Base) MCG/ACT inhaler Commonly known as: ProAir  HFA Inhale 1-2 puffs into the lungs every 4 (four) hours as needed for wheezing or shortness of breath.   ferrous sulfate  325 (65 FE) MG tablet Take 1 tablet (325 mg total) by mouth daily.   ibuprofen  800 MG tablet Commonly known as: ADVIL  Take 1 tablet (800 mg total) by mouth every 8 (eight) hours as needed.   oxyCODONE  5 MG immediate release tablet Commonly known as: Oxy IR/ROXICODONE  Take 1 tablet (5 mg total) by mouth every 6 (six) hours as needed for severe pain (pain score 7-10).   PRENATAL VITAMIN PO Take 1 tablet by mouth daily.         Discharge home in stable condition Infant Feeding: Breast Infant Disposition:home with mother Discharge instruction: per After Visit Summary and Postpartum booklet. Activity: Advance as tolerated. Pelvic rest for 6 weeks.  Diet: routine diet Anticipated Birth Control: Unsure Postpartum Appointment:6 weeks Additional Postpartum F/U: Incision check 2wks Follow up Visit: [facility]   12/29/2023 Lavonia CHRISTELLA Guppy, MD

## 2024-01-12 ENCOUNTER — Telehealth (HOSPITAL_COMMUNITY): Payer: Self-pay | Admitting: *Deleted

## 2024-01-12 NOTE — Telephone Encounter (Signed)
 01/12/2024  Name: Jill Herrera MRN: [id number] DOB: [birth year]  Reason for Call:  Transition of Care Hospital Discharge Call  Contact Status: Patient Contact Status: Complete  Language assistant needed: Interpreter Mode: Interpreter Not Needed        Follow-Up Questions: Do You Have Any Concerns About Your Health As You Heal From Delivery?: No Do You Have Any Concerns About Your Infants Health?: No  Edinburgh Postnatal Depression Scale:  In the Past 7 Days: I have been able to laugh and see the funny side of things.: As much as I always could I have looked forward with enjoyment to things.: As much as I ever did I have blamed myself unnecessarily when things went wrong.: No, never I have been anxious or worried for no good reason.: No, not at all I have felt scared or panicky for no good reason.: No, not at all Things have been getting on top of me.: No, I have been coping as well as ever I have been so unhappy that I have had difficulty sleeping.: Not at all I have felt sad or miserable.: No, not at all I have been so unhappy that I have been crying.: No, never The thought of harming myself has occurred to me.: Never [place] Postnatal Depression Scale Total: 0  PHQ2-9 Depression Scale:     Discharge Follow-up: Edinburgh score requires follow up?: No Patient was advised of the following resources:: Breastfeeding Support Group, Support Group  Post-discharge interventions: Reviewed Newborn Safe Sleep Practices  Steva Tammy PEAK  01/12/2024 1331
